# Patient Record
Sex: Female | Born: 1960 | Race: White | Hispanic: No | Marital: Married | Smoking: Current every day smoker
Health system: Southern US, Community
[De-identification: ages and names within clinical notes are randomized; demographics above are authoritative.]

## PROBLEM LIST (undated history)

## (undated) DIAGNOSIS — I10 Essential (primary) hypertension: Secondary | ICD-10-CM

## (undated) DIAGNOSIS — F10239 Alcohol dependence with withdrawal, unspecified: Secondary | ICD-10-CM

## (undated) DIAGNOSIS — F1010 Alcohol abuse, uncomplicated: Secondary | ICD-10-CM

## (undated) DIAGNOSIS — F32A Depression, unspecified: Secondary | ICD-10-CM

## (undated) DIAGNOSIS — F329 Major depressive disorder, single episode, unspecified: Secondary | ICD-10-CM

## (undated) DIAGNOSIS — I6783 Posterior reversible encephalopathy syndrome: Secondary | ICD-10-CM

## (undated) DIAGNOSIS — I214 Non-ST elevation (NSTEMI) myocardial infarction: Secondary | ICD-10-CM

## (undated) DIAGNOSIS — R569 Unspecified convulsions: Secondary | ICD-10-CM

## (undated) DIAGNOSIS — E785 Hyperlipidemia, unspecified: Secondary | ICD-10-CM

## (undated) DIAGNOSIS — I248 Other forms of acute ischemic heart disease: Secondary | ICD-10-CM

## (undated) DIAGNOSIS — K8020 Calculus of gallbladder without cholecystitis without obstruction: Secondary | ICD-10-CM

---

## 2015-11-18 ENCOUNTER — Inpatient Hospital Stay (HOSPITAL_COMMUNITY): Payer: MEDICAID

## 2015-11-18 ENCOUNTER — Inpatient Hospital Stay (HOSPITAL_COMMUNITY): Payer: Self-pay

## 2015-11-18 ENCOUNTER — Inpatient Hospital Stay (HOSPITAL_COMMUNITY)
Admission: EM | Admit: 2015-11-18 | Discharge: 2015-11-24 | DRG: 896 | Disposition: A | Discharge status: Home / Self Care | Payer: Self-pay | Source: Other Acute Inpatient Hospital | Attending: Internal Medicine | Admitting: Internal Medicine

## 2015-11-18 ENCOUNTER — Encounter (HOSPITAL_COMMUNITY): Payer: Self-pay | Admitting: Pulmonary Disease

## 2015-11-18 DIAGNOSIS — Y900 Blood alcohol level of less than 20 mg/100 ml: Secondary | ICD-10-CM | POA: Diagnosis present

## 2015-11-18 DIAGNOSIS — I959 Hypotension, unspecified: Secondary | ICD-10-CM | POA: Diagnosis present

## 2015-11-18 DIAGNOSIS — F1010 Alcohol abuse, uncomplicated: Secondary | ICD-10-CM | POA: Diagnosis present

## 2015-11-18 DIAGNOSIS — M47812 Spondylosis without myelopathy or radiculopathy, cervical region: Secondary | ICD-10-CM | POA: Diagnosis present

## 2015-11-18 DIAGNOSIS — N179 Acute kidney failure, unspecified: Secondary | ICD-10-CM | POA: Diagnosis not present

## 2015-11-18 DIAGNOSIS — F10231 Alcohol dependence with withdrawal delirium: Secondary | ICD-10-CM | POA: Diagnosis present

## 2015-11-18 DIAGNOSIS — K922 Gastrointestinal hemorrhage, unspecified: Secondary | ICD-10-CM | POA: Diagnosis not present

## 2015-11-18 DIAGNOSIS — F329 Major depressive disorder, single episode, unspecified: Secondary | ICD-10-CM | POA: Diagnosis present

## 2015-11-18 DIAGNOSIS — E876 Hypokalemia: Secondary | ICD-10-CM | POA: Diagnosis not present

## 2015-11-18 DIAGNOSIS — F10239 Alcohol dependence with withdrawal, unspecified: Secondary | ICD-10-CM

## 2015-11-18 DIAGNOSIS — B9562 Methicillin resistant Staphylococcus aureus infection as the cause of diseases classified elsewhere: Secondary | ICD-10-CM | POA: Diagnosis present

## 2015-11-18 DIAGNOSIS — K7290 Hepatic failure, unspecified without coma: Secondary | ICD-10-CM | POA: Diagnosis present

## 2015-11-18 DIAGNOSIS — R569 Unspecified convulsions: Secondary | ICD-10-CM | POA: Diagnosis present

## 2015-11-18 DIAGNOSIS — I6783 Posterior reversible encephalopathy syndrome: Secondary | ICD-10-CM

## 2015-11-18 DIAGNOSIS — Z01818 Encounter for other preprocedural examination: Secondary | ICD-10-CM

## 2015-11-18 DIAGNOSIS — Z6832 Body mass index (BMI) 32.0-32.9, adult: Secondary | ICD-10-CM

## 2015-11-18 DIAGNOSIS — I6789 Other cerebrovascular disease: Secondary | ICD-10-CM

## 2015-11-18 DIAGNOSIS — Z452 Encounter for adjustment and management of vascular access device: Secondary | ICD-10-CM

## 2015-11-18 DIAGNOSIS — F17200 Nicotine dependence, unspecified, uncomplicated: Secondary | ICD-10-CM | POA: Diagnosis present

## 2015-11-18 DIAGNOSIS — I639 Cerebral infarction, unspecified: Secondary | ICD-10-CM

## 2015-11-18 DIAGNOSIS — J9690 Respiratory failure, unspecified, unspecified whether with hypoxia or hypercapnia: Secondary | ICD-10-CM

## 2015-11-18 DIAGNOSIS — I248 Other forms of acute ischemic heart disease: Secondary | ICD-10-CM | POA: Diagnosis present

## 2015-11-18 DIAGNOSIS — K8020 Calculus of gallbladder without cholecystitis without obstruction: Secondary | ICD-10-CM | POA: Diagnosis present

## 2015-11-18 DIAGNOSIS — E785 Hyperlipidemia, unspecified: Secondary | ICD-10-CM | POA: Diagnosis present

## 2015-11-18 DIAGNOSIS — I214 Non-ST elevation (NSTEMI) myocardial infarction: Secondary | ICD-10-CM | POA: Diagnosis present

## 2015-11-18 DIAGNOSIS — J9601 Acute respiratory failure with hypoxia: Secondary | ICD-10-CM | POA: Diagnosis present

## 2015-11-18 DIAGNOSIS — D61818 Other pancytopenia: Secondary | ICD-10-CM | POA: Diagnosis present

## 2015-11-18 DIAGNOSIS — I110 Hypertensive heart disease with heart failure: Secondary | ICD-10-CM | POA: Diagnosis present

## 2015-11-18 DIAGNOSIS — I6523 Occlusion and stenosis of bilateral carotid arteries: Secondary | ICD-10-CM | POA: Diagnosis present

## 2015-11-18 DIAGNOSIS — E872 Acidosis: Secondary | ICD-10-CM | POA: Diagnosis present

## 2015-11-18 DIAGNOSIS — I2489 Other forms of acute ischemic heart disease: Secondary | ICD-10-CM | POA: Diagnosis present

## 2015-11-18 DIAGNOSIS — G9341 Metabolic encephalopathy: Secondary | ICD-10-CM | POA: Diagnosis present

## 2015-11-18 DIAGNOSIS — F419 Anxiety disorder, unspecified: Secondary | ICD-10-CM | POA: Diagnosis present

## 2015-11-18 DIAGNOSIS — J690 Pneumonitis due to inhalation of food and vomit: Secondary | ICD-10-CM | POA: Diagnosis present

## 2015-11-18 DIAGNOSIS — I5021 Acute systolic (congestive) heart failure: Secondary | ICD-10-CM | POA: Diagnosis present

## 2015-11-18 DIAGNOSIS — R451 Restlessness and agitation: Secondary | ICD-10-CM | POA: Diagnosis present

## 2015-11-18 DIAGNOSIS — R7881 Bacteremia: Secondary | ICD-10-CM | POA: Diagnosis present

## 2015-11-18 DIAGNOSIS — F10230 Alcohol dependence with withdrawal, uncomplicated: Secondary | ICD-10-CM

## 2015-11-18 DIAGNOSIS — I6340 Cerebral infarction due to embolism of unspecified cerebral artery: Secondary | ICD-10-CM | POA: Diagnosis present

## 2015-11-18 DIAGNOSIS — F10939 Alcohol use, unspecified with withdrawal, unspecified: Secondary | ICD-10-CM

## 2015-11-18 DIAGNOSIS — G9340 Encephalopathy, unspecified: Secondary | ICD-10-CM | POA: Diagnosis present

## 2015-11-18 DIAGNOSIS — Z8673 Personal history of transient ischemic attack (TIA), and cerebral infarction without residual deficits: Secondary | ICD-10-CM

## 2015-11-18 DIAGNOSIS — R197 Diarrhea, unspecified: Secondary | ICD-10-CM | POA: Diagnosis present

## 2015-11-18 DIAGNOSIS — F10232 Alcohol dependence with withdrawal with perceptual disturbance: Principal | ICD-10-CM | POA: Diagnosis present

## 2015-11-18 DIAGNOSIS — F10931 Alcohol use, unspecified with withdrawal delirium: Secondary | ICD-10-CM | POA: Diagnosis present

## 2015-11-18 DIAGNOSIS — E669 Obesity, unspecified: Secondary | ICD-10-CM | POA: Diagnosis present

## 2015-11-18 HISTORY — DX: Depression, unspecified: F32.A

## 2015-11-18 HISTORY — DX: Alcohol dependence with withdrawal, unspecified: F10.239

## 2015-11-18 HISTORY — DX: Alcohol abuse, uncomplicated: F10.10

## 2015-11-18 HISTORY — DX: Posterior reversible encephalopathy syndrome: I67.83

## 2015-11-18 HISTORY — DX: Hyperlipidemia, unspecified: E78.5

## 2015-11-18 HISTORY — DX: Alcohol use, unspecified with withdrawal, unspecified: F10.939

## 2015-11-18 HISTORY — DX: Major depressive disorder, single episode, unspecified: F32.9

## 2015-11-18 HISTORY — DX: Essential (primary) hypertension: I10

## 2015-11-18 HISTORY — DX: Other forms of acute ischemic heart disease: I24.8

## 2015-11-18 HISTORY — DX: Unspecified convulsions: R56.9

## 2015-11-18 HISTORY — DX: Calculus of gallbladder without cholecystitis without obstruction: K80.20

## 2015-11-18 HISTORY — DX: Non-ST elevation (NSTEMI) myocardial infarction: I21.4

## 2015-11-18 LAB — AMMONIA: AMMONIA: 27 umol/L (ref 9–35)

## 2015-11-18 LAB — LIPASE, BLOOD: LIPASE: 24 U/L (ref 11–51)

## 2015-11-18 LAB — RAPID URINE DRUG SCREEN, HOSP PERFORMED
Amphetamines: NOT DETECTED
BARBITURATES: NOT DETECTED
BENZODIAZEPINES: POSITIVE — AB
COCAINE: NOT DETECTED
Opiates: NOT DETECTED
TETRAHYDROCANNABINOL: NOT DETECTED

## 2015-11-18 LAB — COMPREHENSIVE METABOLIC PANEL
ALT: 51 U/L (ref 14–54)
AST: 182 U/L — AB (ref 15–41)
Albumin: 3 g/dL — ABNORMAL LOW (ref 3.5–5.0)
Alkaline Phosphatase: 66 U/L (ref 38–126)
Anion gap: 13 (ref 5–15)
BUN: 9 mg/dL (ref 6–20)
CHLORIDE: 106 mmol/L (ref 101–111)
CO2: 20 mmol/L — ABNORMAL LOW (ref 22–32)
CREATININE: 0.69 mg/dL (ref 0.44–1.00)
Calcium: 7.3 mg/dL — ABNORMAL LOW (ref 8.9–10.3)
GFR calc Af Amer: >60 mL/min (ref >60)
Glucose, Bld: 93 mg/dL (ref 65–99)
Potassium: 3.1 mmol/L — ABNORMAL LOW (ref 3.5–5.1)
Sodium: 139 mmol/L (ref 135–145)
Total Bilirubin: 1.4 mg/dL — ABNORMAL HIGH (ref 0.3–1.2)
Total Protein: 5.1 g/dL — ABNORMAL LOW (ref 6.5–8.1)

## 2015-11-18 LAB — CBC WITH DIFFERENTIAL/PLATELET
BASOS ABS: 0 10*3/uL (ref 0.0–0.1)
BASOS PCT: 0 %
EOS ABS: 0 10*3/uL (ref 0.0–0.7)
EOS PCT: 0 %
HCT: 32.3 % — ABNORMAL LOW (ref 36.0–46.0)
Hemoglobin: 11 g/dL — ABNORMAL LOW (ref 12.0–15.0)
LYMPHS PCT: 13 %
Lymphs Abs: 0.7 10*3/uL (ref 0.7–4.0)
MCH: 33.4 pg (ref 26.0–34.0)
MCHC: 34.1 g/dL (ref 30.0–36.0)
MCV: 98.2 fL (ref 78.0–100.0)
Monocytes Absolute: 0.2 10*3/uL (ref 0.1–1.0)
Monocytes Relative: 4 %
Neutro Abs: 4.4 10*3/uL (ref 1.7–7.7)
Neutrophils Relative %: 83 %
PLATELETS: 64 10*3/uL — AB (ref 150–400)
RBC: 3.29 MIL/uL — AB (ref 3.87–5.11)
RDW: 14.6 % (ref 11.5–15.5)
WBC: 5.3 10*3/uL (ref 4.0–10.5)

## 2015-11-18 LAB — BLOOD GAS, ARTERIAL
Acid-base deficit: 5.5 mmol/L — ABNORMAL HIGH (ref 0.0–2.0)
BICARBONATE: 19.4 meq/L — AB (ref 20.0–24.0)
DRAWN BY: 398981
FIO2: 0.4
O2 Saturation: 98.9 %
PCO2 ART: 38.1 mmHg (ref 35.0–45.0)
PEEP: 5 cmH2O
Patient temperature: 98.6
RATE: 18 resp/min
TCO2: 20.6 mmol/L (ref 0–100)
VT: 420 mL
pH, Arterial: 7.328 — ABNORMAL LOW (ref 7.350–7.450)
pO2, Arterial: 133 mmHg — ABNORMAL HIGH (ref 80.0–100.0)

## 2015-11-18 LAB — PHOSPHORUS
Phosphorus: 2.8 mg/dL (ref 2.5–4.6)
Phosphorus: 3 mg/dL (ref 2.5–4.6)

## 2015-11-18 LAB — MRSA PCR SCREENING: MRSA BY PCR: POSITIVE — AB

## 2015-11-18 LAB — HEPARIN LEVEL (UNFRACTIONATED): Heparin Unfractionated: 0.27 IU/mL — ABNORMAL LOW (ref 0.30–0.70)

## 2015-11-18 LAB — TROPONIN I
TROPONIN I: 1.9 ng/mL — AB (ref <0.031)
Troponin I: 6.07 ng/mL (ref <0.031)
Troponin I: 3.26 ng/mL (ref <0.031)

## 2015-11-18 LAB — MAGNESIUM
MAGNESIUM: 1.9 mg/dL (ref 1.7–2.4)
MAGNESIUM: 1.7 mg/dL (ref 1.7–2.4)

## 2015-11-18 LAB — ECHOCARDIOGRAM COMPLETE
HEIGHTINCHES: 63 in
WEIGHTICAEL: 2853.63 [oz_av]

## 2015-11-18 LAB — CK TOTAL AND CKMB (NOT AT ARMC)
CK, MB: 27 ng/mL — ABNORMAL HIGH (ref 0.5–5.0)
Relative Index: 10.7 — ABNORMAL HIGH (ref 0.0–2.5)
Total CK: 252 U/L — ABNORMAL HIGH (ref 38–234)

## 2015-11-18 LAB — APTT: APTT: 31 s (ref 24–37)

## 2015-11-18 LAB — PROTIME-INR
INR: 1.09 (ref 0.00–1.49)
PROTHROMBIN TIME: 14.3 s (ref 11.6–15.2)

## 2015-11-18 LAB — AMYLASE: AMYLASE: 64 U/L (ref 28–100)

## 2015-11-18 LAB — OCCULT BLOOD GASTRIC / DUODENUM (SPECIMEN CUP): Occult Blood, Gastric: POSITIVE — AB

## 2015-11-18 LAB — TRIGLYCERIDES: Triglycerides: 428 mg/dL — ABNORMAL HIGH (ref <150)

## 2015-11-18 LAB — CK: CK TOTAL: 268 U/L — AB (ref 38–234)

## 2015-11-18 LAB — PROCALCITONIN: Procalcitonin: 0.27 ng/mL

## 2015-11-18 LAB — POTASSIUM: Potassium: 3.2 mmol/L — ABNORMAL LOW (ref 3.5–5.1)

## 2015-11-18 LAB — LACTIC ACID, PLASMA: LACTIC ACID, VENOUS: 0.8 mmol/L (ref 0.5–2.0)

## 2015-11-18 MED ORDER — IOPAMIDOL (ISOVUE-300) INJECTION 61%
INTRAVENOUS | Status: AC
  Administered 2015-11-18: 100 mL
  Filled 2015-11-18: qty 100

## 2015-11-18 MED ORDER — PROPOFOL 1000 MG/100ML IV EMUL
5.0000 ug/kg/min | INTRAVENOUS | Status: DC
  Administered 2015-11-18 (×2): 30 ug/kg/min via INTRAVENOUS
  Administered 2015-11-18: 50 ug/kg/min via INTRAVENOUS
  Administered 2015-11-19: 45 ug/kg/min via INTRAVENOUS
  Administered 2015-11-19: 50 ug/kg/min via INTRAVENOUS
  Administered 2015-11-19 – 2015-11-20 (×3): 60 ug/kg/min via INTRAVENOUS
  Administered 2015-11-20: 30 ug/kg/min via INTRAVENOUS
  Administered 2015-11-20: 60 ug/kg/min via INTRAVENOUS
  Filled 2015-11-18 (×12): qty 100

## 2015-11-18 MED ORDER — PANTOPRAZOLE SODIUM 40 MG IV SOLR: 40.0000 mg | Freq: Every day | INTRAVENOUS | Status: DC

## 2015-11-18 MED ORDER — ANTISEPTIC ORAL RINSE SOLUTION (CORINZ)
7.0000 mL | Freq: Four times a day (QID) | OROMUCOSAL | Status: DC
Start: 2015-11-18 — End: 2015-11-19
  Administered 2015-11-18 – 2015-11-19 (×5): 7 mL via OROMUCOSAL

## 2015-11-18 MED ORDER — CHLORHEXIDINE GLUCONATE 0.12% ORAL RINSE (MEDLINE KIT)
15.0000 mL | Freq: Two times a day (BID) | OROMUCOSAL | Status: DC
  Administered 2015-11-18 – 2015-11-20 (×5): 15 mL via OROMUCOSAL

## 2015-11-18 MED ORDER — DEXMEDETOMIDINE HCL IN NACL 200 MCG/50ML IV SOLN
0.0000 ug/kg/h | INTRAVENOUS | Status: DC
  Administered 2015-11-18: 0.5 ug/kg/h via INTRAVENOUS
  Filled 2015-11-18: qty 50

## 2015-11-18 MED ORDER — PHENYLEPHRINE HCL 10 MG/ML IJ SOLN
30.0000 ug/min | INTRAVENOUS | Status: DC
  Administered 2015-11-18: 30 ug/min via INTRAVENOUS
  Filled 2015-11-18: qty 1

## 2015-11-18 MED ORDER — SODIUM CHLORIDE 0.9 % IV SOLN
3.0000 g | Freq: Three times a day (TID) | INTRAVENOUS | Status: DC
  Administered 2015-11-18 – 2015-11-24 (×18): 3 g via INTRAVENOUS
  Filled 2015-11-18 (×24): qty 3

## 2015-11-18 MED ORDER — MIDAZOLAM HCL 2 MG/2ML IJ SOLN
INTRAMUSCULAR | Status: AC
  Administered 2015-11-18: 2 mg
  Filled 2015-11-18: qty 2

## 2015-11-18 MED ORDER — HEPARIN SODIUM (PORCINE) 5000 UNIT/ML IJ SOLN
5000.0000 [IU] | Freq: Three times a day (TID) | INTRAMUSCULAR | Status: DC
  Administered 2015-11-18: 5000 [IU] via SUBCUTANEOUS
  Filled 2015-11-18: qty 1

## 2015-11-18 MED ORDER — PANTOPRAZOLE SODIUM 40 MG IV SOLR
40.0000 mg | Freq: Two times a day (BID) | INTRAVENOUS | Status: DC
  Administered 2015-11-18 – 2015-11-22 (×10): 40 mg via INTRAVENOUS
  Filled 2015-11-18 (×10): qty 40

## 2015-11-18 MED ORDER — PROPOFOL 1000 MG/100ML IV EMUL
0.0000 ug/kg/min | INTRAVENOUS | Status: DC
  Administered 2015-11-18: 19.984 ug/kg/min via INTRAVENOUS

## 2015-11-18 MED ORDER — SODIUM CHLORIDE 0.9 % IV SOLN
1000.0000 mg | Freq: Two times a day (BID) | INTRAVENOUS | Status: DC
  Administered 2015-11-19 – 2015-11-22 (×9): 1000 mg via INTRAVENOUS
  Filled 2015-11-18 (×12): qty 10

## 2015-11-18 MED ORDER — PHENYLEPHRINE HCL 10 MG/ML IJ SOLN
30.0000 ug/min | INTRAVENOUS | Status: DC
  Administered 2015-11-18 (×2): 150 ug/min via INTRAVENOUS
  Administered 2015-11-18: 140 ug/min via INTRAVENOUS
  Administered 2015-11-19: 120 ug/min via INTRAVENOUS
  Administered 2015-11-19: 40 ug/min via INTRAVENOUS
  Filled 2015-11-18 (×6): qty 4

## 2015-11-18 MED ORDER — PNEUMOCOCCAL VAC POLYVALENT 25 MCG/0.5ML IJ INJ
0.5000 mL | INJECTION | INTRAMUSCULAR | Status: AC
  Administered 2015-11-19: 0.5 mL via INTRAMUSCULAR
  Filled 2015-11-18: qty 0.5

## 2015-11-18 MED ORDER — POTASSIUM CHLORIDE CRYS ER 20 MEQ PO TBCR
40.0000 meq | EXTENDED_RELEASE_TABLET | Freq: Once | ORAL | Status: AC
  Administered 2015-11-18: 40 meq via ORAL
  Filled 2015-11-18: qty 2

## 2015-11-18 MED ORDER — FENTANYL CITRATE (PF) 100 MCG/2ML IJ SOLN
100.0000 ug | INTRAMUSCULAR | Status: AC | PRN
  Administered 2015-11-18 – 2015-11-19 (×3): 100 ug via INTRAVENOUS
  Filled 2015-11-18 (×2): qty 2

## 2015-11-18 MED ORDER — HEPARIN (PORCINE) IN NACL 100-0.45 UNIT/ML-% IJ SOLN
1100.0000 [IU]/h | INTRAMUSCULAR | Status: AC
  Administered 2015-11-18: 850 [IU]/h via INTRAVENOUS
  Administered 2015-11-19: 1100 [IU]/h via INTRAVENOUS
  Filled 2015-11-18 (×4): qty 250

## 2015-11-18 MED ORDER — SODIUM CHLORIDE 0.9 % IV SOLN
INTRAVENOUS | Status: DC
  Administered 2015-11-18 – 2015-11-20 (×3): via INTRAVENOUS

## 2015-11-18 MED ORDER — LACTULOSE 10 GM/15ML PO SOLN: 30.0000 g | Freq: Two times a day (BID) | ORAL | Status: DC

## 2015-11-18 MED ORDER — SODIUM CHLORIDE 0.9 % IV BOLUS (SEPSIS)
1000.0000 mL | Freq: Once | INTRAVENOUS | Status: AC
  Administered 2015-11-18: 1000 mL via INTRAVENOUS

## 2015-11-18 MED ORDER — FENTANYL CITRATE (PF) 100 MCG/2ML IJ SOLN
100.0000 ug | INTRAMUSCULAR | Status: DC | PRN
  Administered 2015-11-20: 100 ug via INTRAVENOUS
  Filled 2015-11-18 (×2): qty 2

## 2015-11-18 MED ORDER — SODIUM CHLORIDE 0.9 % IV SOLN: 250.0000 mL | INTRAVENOUS | Status: DC | PRN

## 2015-11-18 MED ORDER — LORAZEPAM 2 MG/ML IJ SOLN: 2.0000 mg | INTRAMUSCULAR | Status: DC | PRN

## 2015-11-18 MED ORDER — PROPOFOL 1000 MG/100ML IV EMUL
INTRAVENOUS | Status: AC
  Filled 2015-11-18: qty 100

## 2015-11-18 MED ORDER — SODIUM CHLORIDE 0.9 % IV SOLN
Freq: Once | INTRAVENOUS | Status: AC
  Administered 2015-11-18: 08:00:00 via INTRAVENOUS

## 2015-11-18 MED ORDER — MIDAZOLAM HCL 2 MG/2ML IJ SOLN: 2.0000 mg | Freq: Once | INTRAMUSCULAR | Status: AC

## 2015-11-18 NOTE — Procedures (Signed)
Central Venous Catheter Insertion Procedure Note Abigail HutchingSusan Hull [id number] 1961/02/27  Procedure: Insertion of Central Venous Catheter Indications: Assessment of intravascular volume, Drug and/or fluid administration and Frequent blood sampling  Procedure Details Consent: Risks of procedure as well as the alternatives and risks of each were explained to the (patient/caregiver).  Consent for procedure obtained. Time Out: Verified patient identification, verified procedure, site/side was marked, verified correct patient position, special equipment/implants available, medications/allergies/relevent history reviewed, required imaging and test results available.  Performed  Maximum sterile technique was used including antiseptics, cap, gloves, gown, hand hygiene, mask and sheet. Skin prep: Chlorhexidine; local anesthetic administered A antimicrobial bonded/coated triple lumen catheter was placed in the left internal jugular vein using the Seldinger technique.  Evaluation Blood flow good Complications: No apparent complications Patient did tolerate procedure well. Chest X-ray ordered to verify placement.  CXR: pending.  Procedure performed under direct ultrasound guidance for real time vessel cannulation.      Abigail Hull, [place]PA - [facility] Pager: [phone]  or [phone] 11/18/2015, 10:51 AM

## 2015-11-18 NOTE — Consult Note (Signed)
Requesting Physician: Dr.  Corrie Dandy    Reason for consultation:  To evaluate for encephalopathy  HPI:                                                                                                                                         Abigail Hull is an 55 y.o. female patient who is admitted to the [facility] ICU with altered mental status, intubated due to respiratory failure.  past medical history significant for alcohol abuse and hypertension. 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 She typically drinks about 1 pint of hard alcohol daily, and has not had a drink for about 3 days now. She was last seen normal 5/16 at around noon. Since that time she has had significant nausea vomiting, blurry vision, difficulty ambulating, and apparent hallucinations. Her mental status continued to worsen since that time, until her husband witnessed her having a seizure-like episode and called EMS. Upon arrival to [facility] emergency department she was unresponsive and emergently intubated for airway protection. CT of the head showed bilateral areas of bilateral parietal occipital foci of poorly defined hypoattenuation. Ammonia level was also elevated. She was transferred to [facility] for ICU admission.  Past Medical History: Past Medical History  Diagnosis Date  . Hypertension   . Depression   . Alcohol abuse   . Cholelithiasis     No past surgical history on file.  Family History: No family history on file.  Social History:   has no tobacco, alcohol, and drug history on file.  Allergies:  Not on File   Medications:                                                                                                                         Current facility-administered medications:  .  0.9 %  sodium chloride infusion, 250 mL, Intravenous, PRN, Corey Harold, NP .  0.9 %  sodium chloride infusion, ,  Intravenous, Continuous, Jose Angelo A Corrie Dandy, MD, Last Rate: 100 mL/hr at 11/18/15 1800 .  Ampicillin-Sulbactam (UNASYN) 3 g in sodium chloride 0.9 % 100 mL IVPB, 3 g, Intravenous, Q8H, Romona Curls, RPH, 3 g at 11/18/15 1815 .  antiseptic oral rinse solution (CORINZ), 7 mL, Mouth Rinse, QID, Jose  Shirl Harris, MD, 7 mL at 11/18/15 1540 .  chlorhexidine gluconate (SAGE KIT) (PERIDEX) 0.12 % solution 15 mL, 15 mL, Mouth Rinse, BID, Jose Angelo A de Fenton, MD, 15 mL at 11/18/15 0900 .  fentaNYL (SUBLIMAZE) injection 100 mcg, 100 mcg, Intravenous, Q15 min PRN, Corey Harold, NP, 100 mcg at 11/18/15 [phone] .  fentaNYL (SUBLIMAZE) injection 100 mcg, 100 mcg, Intravenous, Q2H PRN, Corey Harold, NP .  heparin ADULT infusion 100 units/mL (25000 units/250 mL), 850 Units/hr, Intravenous, Continuous, Romona Curls, RPH, Last Rate: 8.5 mL/hr at 11/18/15 1800, 850 Units/hr at 11/18/15 1800 .  LORazepam (ATIVAN) injection 2 mg, 2 mg, Intravenous, Q5 min PRN, Corey Harold, NP .  pantoprazole (PROTONIX) injection 40 mg, 40 mg, Intravenous, Q12H, Corey Harold, NP, 40 mg at 11/18/15 1100 .  phenylephrine (NEO-SYNEPHRINE) 40 mg in dextrose 5 % 250 mL (0.16 mg/mL) infusion, 30-200 mcg/min, Intravenous, Continuous, Raylene Miyamoto, MD, Last Rate: 56.3 mL/hr at 11/18/15 1800, 150 mcg/min at 11/18/15 1800 .  [START ON 11/19/2015] pneumococcal 23 valent vaccine (PNU-IMMUNE) injection 0.5 mL, 0.5 mL, Intramuscular, Tomorrow-1000, Corey Harold, NP .  propofol (DIPRIVAN) 1000 MG/100ML infusion, 5-70 mcg/kg/min, Intravenous, Titrated, Jose Angelo A Corrie Dandy, MD, Last Rate: 14.6 mL/hr at 11/18/15 1800, 30 mcg/kg/min at 11/18/15 1800   ROS:                                                                                                                                       History   unobtainable from patient due to intubated  Neurologic Examination:                                                                                                     Today's Vitals   11/18/15 1512 11/18/15 1600 11/18/15 1700 11/18/15 1800  BP:  125/91 138/86 136/74  Pulse:  60 74 63  Temp:  101.1 F (38.4 C) 101.3 F (38.5 C) 101.8 F (38.8 C)  TempSrc:      Resp:  20 20 18   Height:      Weight:      SpO2: 100% 98% 100% 100%   Intubated, sedated, no gaze deviation or nystagmus. Pupils equal and reactive, corneal's present. Gag present. Withdraws to stimulus in all 4 extremities.      Lab Results: Basic Metabolic Panel:  Recent Labs Lab 11/18/15 0643 11/18/15 1200  NA 139  --   K 3.1* 3.2*  CL  106  --   CO2 20*  --   GLUCOSE 93  --   BUN 9  --   CREATININE 0.69  --   CALCIUM 7.3*  --   MG 1.9 1.7  PHOS 2.8 3.0    Liver Function Tests:  Recent Labs Lab 11/18/15 0643  AST 182*  ALT 51  ALKPHOS 66  BILITOT 1.4*  PROT 5.1*  ALBUMIN 3.0*    Recent Labs Lab 11/18/15 0643  LIPASE 24  AMYLASE 64    Recent Labs Lab 11/18/15 0704  AMMONIA 27    CBC:  Recent Labs Lab 11/18/15 0643  WBC 5.3  NEUTROABS 4.4  HGB 11.0*  HCT 32.3*  MCV 98.2  PLT 64*    Cardiac Enzymes:  Recent Labs Lab 11/18/15 0643 11/18/15 1200  CKTOTAL 268* 252*  CKMB  --  27.0*  TROPONINI 6.07* 3.26*    Lipid Panel:  Recent Labs Lab 11/18/15 0704  TRIG 428*    CBG: No results for input(s): GLUCAP in the last 168 hours.  Microbiology: Results for orders placed or performed during the hospital encounter of 11/18/15  MRSA PCR Screening     Status: Abnormal   Collection Time: 11/18/15  5:25 AM  Result Value Ref Range Status   MRSA by PCR POSITIVE (A) NEGATIVE Final    Comment:        The GeneXpert MRSA Assay (FDA approved for NASAL specimens only), is one component of a comprehensive MRSA colonization surveillance program. It is not intended to diagnose MRSA infection nor to guide or monitor treatment for MRSA infections. RESULT CALLED TO, READ BACK BY AND VERIFIED WITH: Birdena Jubilee RN  9:35 11/18/15 ([email or web address])      Imaging: Ct Head Wo Contrast  11/18/2015  CLINICAL DATA:  Acute encephalopathy. Rule out intracranial hemorrhage before starting heparin. Bilateral parietal occipital hypo attenuation on outside CT per EMR, presumed stroke EXAM: CT HEAD WITHOUT CONTRAST TECHNIQUE: Contiguous axial images were obtained from the base of the skull through the vertex without intravenous contrast. COMPARISON:  None. FINDINGS: Skull and Sinuses:Negative for fracture or destructive process. The visualized mastoids, middle ears, and imaged paranasal sinuses are clear. Advanced right TMJ arthritis. Visualized orbits: Negative. Brain: There is cortical and subcortical low-density fairly symmetrically in the occipital and parietal regions without superimposed hemorrhage. No high-density vessel sign. Elsewhere normal appearance of the brain. No hydrocephalus. IMPRESSION: Symmetric parietal-occipital cortically based edema or infarct with pattern suggesting posterior reversible encephalopathy syndrome. No superimposed hemorrhage. Electronically Signed   By: Monte Fantasia M.D.   On: 11/18/2015 11:55   Ct Abdomen Pelvis W Contrast  11/18/2015  CLINICAL DATA:  Evaluate the source of blood from NG tube, intubated EXAM: CT ABDOMEN AND PELVIS WITH CONTRAST TECHNIQUE: Multidetector CT imaging of the abdomen and pelvis was performed using the standard protocol following bolus administration of intravenous contrast. CONTRAST:  163m ISOVUE-300 IOPAMIDOL (ISOVUE-300) INJECTION 61% COMPARISON:  None. FINDINGS: Lower chest: Bilateral tiny pleural effusion. There is bilateral lower lobe posterior small atelectasis or infiltrate. Hepatobiliary: Significant fatty infiltration of the liver. Mild hepatomegaly. There is thickening of gallbladder wall up to 6.5 mm. Probable gallbladder sludge. There is a calcified gallstone in gallbladder neck region measures 9.5 mm. No intrahepatic biliary ductal dilatation. No CBD  dilatation. Pancreas: Enhanced pancreas is unremarkable. Spleen: Enhanced spleen is unremarkable. Adrenals/Urinary Tract: No adrenal gland mass. Enhanced kidneys are symmetrical in size. No hydronephrosis or hydroureter. Delayed renal images shows bilateral renal symmetrical excretion. Bilateral visualized  proximal ureter is unremarkable. There is mild distended urinary bladder. A Foley catheter is noted within urinary bladder. Small amount of air within urinary bladder anteriorly is probable post instrumentation. Stomach/Bowel: There is no gastric outlet obstruction. There is a NG tube in place with tip in proximal stomach against the lateral gastric wall. There is no evidence of gastric wall hematoma adjacent to tip of the NG tube at. No evidence of intraluminal hematoma. No pericecal inflammation. Terminal ileum is unremarkable. Normal appendix partially visualized in axial image 72. There is no distal colonic obstruction. The right colon descending colon and sigmoid colon is empty collapsed. Some colonic gas noted in transverse colon. No small bowel obstruction. No thickened or dilated small bowel loops. Vascular/Lymphatic: There is no retroperitoneal or mesenteric adenopathy. No aortic aneurysm. Reproductive: The uterus and ovaries are unremarkable. The uterus is small size. No adnexal mass. Other: No ascites or free air. No retroperitoneal or mesenteric hematoma. No inguinal adenopathy. Musculoskeletal: No destructive bony lesions are noted. Degenerative changes are noted lumbar spine. Probable Schmorl's node deformity noted upper endplate of L4 vertebral body. Significant disc space flattening with vacuum disc phenomenon mild anterior spurring and endplate sclerotic changes at L1-L2 and L2-L3 level. Mild upper lumbar levoscoliosis. IMPRESSION: 1. There is significant fatty infiltration of the liver. Mild hepatomegaly. 2. There is thickening of gallbladder wall up to 6.5 mm. Clinical correlation is necessary  to exclude cholecystitis. Further correlation with gallbladder ultrasound could be performed as clinically warranted. Probable sludge within gallbladder lumen. There is a calcified gallstone in neck of the gallbladder measures 9.5 mm. 3. There is NG tube with tip in proximal stomach with tip against the lateral gastric wall. There is no evidence of intraluminal or mural hematoma. 4. Tiny bilateral pleural effusion with bilateral basilar posterior atelectasis or infiltrate. 5. Normal appendix.  No pericecal inflammation. 6. No small bowel obstruction. 7. Mild distended urinary bladder. Small amount of air within urinary bladder probable post instrumentation. The bladder catheter is noted. 8. Unremarkable uterus and ovaries. 9. Degenerative changes lumbar spine. Electronically Signed   By: Lahoma Crocker M.D.   On: 11/18/2015 12:59   Dg Chest Port 1 View  11/18/2015  CLINICAL DATA:  Status post central line placement EXAM: PORTABLE CHEST 1 VIEW COMPARISON:  11/18/2015 FINDINGS: Endotracheal tube and nasogastric catheter are again seen and stable. A new left jugular central line is noted with the catheter tip in the mid superior vena cava. No pneumothorax is noted. Some crowding of the vascular markings is noted due to a poor inspiratory effort. IMPRESSION: No pneumothorax following central line placement. Electronically Signed   By: Inez Catalina M.D.   On: 11/18/2015 11:19   Dg Chest Port 1 View  11/18/2015  CLINICAL DATA:  Endotracheal tube.  History of hypertension. EXAM: PORTABLE CHEST 1 VIEW COMPARISON:  None. FINDINGS: Endotracheal tube placed with tip measuring 3.5 cm above the carina. Enteric tube is present with tip off of the field of view but below the left hemidiaphragm. Heart size and pulmonary vascularity are normal. Tortuous aorta. Prominent right paratracheal shadow is likely vascular. No focal airspace disease or consolidation in the lungs. No blunting of costophrenic angles. No pneumothorax.  IMPRESSION: Appliances appear in satisfactory position. No evidence of active pulmonary disease. Electronically Signed   By: Lucienne Capers M.D.   On: 11/18/2015 06:48    Assessment and plan:   Opal Dinning is an 55 y.o. female patient who presented with AMS, etoh abuse, CT head  is suggestive of  PRES.  EEG showed no evidence of seizures,  but frequent abnormal discharges noted, localized to right frontal leads.  Will start keppra 1 gm Q12H.   We'll follow-up

## 2015-11-18 NOTE — Progress Notes (Signed)
CRITICAL VALUE ALERT  Critical value received: troponin 6.07  Date of notification:  5/17  Time of notification: 0752   Critical value read back:Yes.    Nurse who received alert:  Verlin DikeKimberly Araf Clugston  MD notified (1st page):  Dr. Tommi Rumpse dios  Time of first page:  0753  MD notified (2nd page):  Time of second page:  Responding MD: Dr. Tommi Rumpse dios  Time MD responded:  [phone]

## 2015-11-18 NOTE — Progress Notes (Signed)
ANTICOAGULATION CONSULT NOTE - Initial Consult  Pharmacy Consult for heparin Indication: NSTEMI, if CT head neg for bleed  Allergies not on file  Patient Measurements: Height: 5\' 3"  (160 cm) Weight: 178 lb 5.6 oz (80.9 kg) IBW/kg (Calculated) : 52.4 Heparin Dosing Weight: 70.1 kg  Vital Signs: Temp: 100.6 F (38.1 C) (05/17 1005) Temp Source: Core (Comment) (05/17 0500) BP: 66/55 mmHg (05/17 1005) Pulse Rate: 94 (05/17 1005)  Labs:  Recent Labs  11/18/15 0643  HGB 11.0*  HCT 32.3*  PLT 64*  LABPROT 14.3  INR 1.09  CREATININE 0.69  CKTOTAL 268*  TROPONINI 6.07*    Estimated Creatinine Clearance: 81.9 mL/min (by C-G formula based on Cr of 0.69).   Medical History: Past Medical History  Diagnosis Date  . Hypertension   . Depression   . Alcohol abuse   . Cholelithiasis     Assessment: [age] yof with seizure-like activity on admit. Pharmacy consulted to dose heparin for NSTEMI if CT head neg for bleed. Troponin rising. Unable to obtain a med rec hx but previous notes do not mention AC pta. HG 11, plt 64. CT of head is negative for bleed. Start heparin with no bolus per CCM.  Currently on SQ heparin - last dose this AM at ~0630.  Goal of Therapy:  Heparin level 0.3-0.7 units/ml Monitor platelets by anticoagulation protocol: Yes   Plan:  D/c SQ heparin No bolus per CCM Start heparin at 850 units/h 6h HL Daily HL/CBC Mon s/sx bleeding   Babs BertinHaley Zyen Triggs, PharmD, [facility] BridgewayBCPS Clinical Pharmacist Pager [phone] 11/18/2015 10:43 AM

## 2015-11-18 NOTE — Progress Notes (Signed)
  Echocardiogram 2D Echocardiogram has been performed.  Janalyn HarderWest, Laquetta Racey R 11/18/2015, 2:35 PM

## 2015-11-18 NOTE — Progress Notes (Signed)
  Pharmacy Antibiotic Note  Abigail HutchingSusan Hull is a 55 y.o. female admitted on 11/18/2015 with pneumonia.  Pharmacy has been consulted for Unasyn dosing. Tmax/24h 100.6, wbc wnl, LA 0.58. CrCl~81  Plan: Unasyn 3g IV q8h Monitor clinical progress, c/s, renal function, abx plan/LOT  Height: 5\' 3"  (160 cm) Weight: 178 lb 5.6 oz (80.9 kg) IBW/kg (Calculated) : 52.4  Temp (24hrs), Avg:100.5 F (38.1 C), Min:100.4 F (38 C), Max:100.6 F (38.1 C)   Recent Labs Lab 11/18/15 0643  WBC 5.3  CREATININE 0.69  LATICACIDVEN 0.8    Estimated Creatinine Clearance: 81.9 mL/min (by C-G formula based on Cr of 0.69).    Allergies not on file  Antimicrobials this admission: 5/17 Unasyn >>   Dose adjustments this admission:   Microbiology results: 5/17 BCx:  5/17 Sputum: 5/17 MRSA PCR:  Babs BertinHaley Shivaun Bilello, PharmD, [facility] Bucks CountyBCPS Clinical Pharmacist Pager [phone] 11/18/2015 10:53 AM

## 2015-11-18 NOTE — Progress Notes (Signed)
RT note-EEG performed with diprovan off, patient is very restless, RN to start diprovan again.

## 2015-11-18 NOTE — Progress Notes (Signed)
EEG Completed; Results Pending  

## 2015-11-18 NOTE — Progress Notes (Signed)
ANTICOAGULATION CONSULT NOTE - Follow-up Consult  Pharmacy Consult for heparin Indication: NSTEMI  Not on File  Patient Measurements: Height: 5\' 3"  (160 cm) Weight: 178 lb 5.6 oz (80.9 kg) IBW/kg (Calculated) : 52.4 Heparin Dosing Weight: 70.1 kg  Vital Signs: Temp: 100.6 F (38.1 C) (05/17 2145) Temp Source: Core (Comment) (05/17 2000) BP: 128/81 mmHg (05/17 2145) Pulse Rate: 68 (05/17 2145)  Labs:  Recent Labs  11/18/15 0643 11/18/15 1200 11/18/15 1839 11/18/15 2135  HGB 11.0*  --   --   --   HCT 32.3*  --   --   --   PLT 64*  --   --   --   APTT  --  31  --   --   LABPROT 14.3  --   --   --   INR 1.09  --   --   --   HEPARINUNFRC  --   --   --  0.27*  CREATININE 0.69  --   --   --   CKTOTAL 268* 252*  --   --   CKMB  --  27.0*  --   --   TROPONINI 6.07* 3.26* 1.90*  --     Estimated Creatinine Clearance: 81.9 mL/min (by C-G formula based on Cr of 0.69).   Assessment: 54 yof on heparin for NSTEMI. Heparin level slightly subtherapeutic on 850 units/hr. Trop peak 6.07, now trending down. No issues with line or bleeding reported per RN. Baseline plt 64 - will need to follow closely.  Goal of Therapy:  Heparin level 0.3-0.7 units/ml Monitor platelets by anticoagulation protocol: Yes   Plan:  Increase heparin to 1000 units/h F/u a.m. heparin level   Christoper Fabianaron Tangelia Sanson, PharmD, BCPS Clinical pharmacist, pager [phone] 11/18/2015 11:05 PM

## 2015-11-18 NOTE — H&P (Signed)
PULMONARY / CRITICAL CARE MEDICINE   Name: Abigail Hull MRN: [id number] DOB: [birth year]    ADMISSION DATE:  11/18/2015 CONSULTATION DATE:  11/18/2015  REFERRING MD:  [facility]  CHIEF COMPLAINT:  Withdrawal/seizure  HISTORY OF PRESENT ILLNESS:   55 year old female past medical history significant for alcohol abuse and hypertension. 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 She typically drinks about 1 pint of hard alcohol daily, and has not had a drink for about 3 days now. She was last seen normal 5/16 at around noon. Since that time she has had significant nausea vomiting, blurry vision, difficulty ambulating, and apparent hallucinations. Her mental status continued to worsen since that time, until her husband witnessed her having a seizure-like episode and called EMS.  Upon arrival to [facility] emergency department she was unresponsive and emergently intubated for airway protection. CT of the head showed bilateral areas of bilateral parietal occipital foci of poorly defined hypoattenuation. Ammonia level was also elevated. She was transferred to [facility] for ICU admission.  PAST MEDICAL HISTORY :  She  has a past medical history of Hypertension; Depression; Alcohol abuse; and Cholelithiasis.  PAST SURGICAL HISTORY: She  has no past surgical history on file.  Limited. No family around.   Allergies not on file  No current facility-administered medications on file prior to encounter.   No current outpatient prescriptions on file prior to encounter.    FAMILY HISTORY:  Her has no family status information on file.   Limited as no family around.   SOCIAL HISTORY: She  is married. Husband was unavailable. Limited as no family around.   REVIEW OF SYSTEMS:   Unable  SUBJECTIVE:  Pt's BP was 70-80 systolic on transfer to [facility]. Propofol was dc'd and started on precedex drip. Started  moving around and agitated. Switched back to propofol and neo was started.   Comfortable.   VITAL SIGNS: BP 92/68 mmHg  Pulse 81  Temp(Src) 100.6 F (38.1 C) (Core (Comment))  Resp 18  Ht  (1.6 m)  Wt 80.9 kg (178 lb 5.6 oz)  BMI 31.60 kg/m2  SpO2 98%  HEMODYNAMICS:    VENTILATOR SETTINGS: Vent Mode:  [-]  FiO2 (%):  [40 %] 40 %  INTAKE / OUTPUT:    PHYSICAL EXAMINATION: General:  Overweight female in NAD on vent Neuro:  Spontaneously awake on vent and following commands HEENT:  [place]/AT, PERRL, no JVD Cardiovascular:  RRR, no MRG Lungs:  Clear bilateral breath sounds Abdomen:  Soft, non-tender, non-distended Musculoskeletal: No acute deformity Skin:  Grossly intact  LABS:  BMET No results for input(s): NA, K, CL, CO2, BUN, CREATININE, GLUCOSE in the last 168 hours.  Electrolytes No results for input(s): CALCIUM, MG, PHOS in the last 168 hours.  CBC No results for input(s): WBC, HGB, HCT, PLT in the last 168 hours.  Coag's No results for input(s): APTT, INR in the last 168 hours.  Sepsis Markers No results for input(s): LATICACIDVEN, PROCALCITON, O2SATVEN in the last 168 hours.  ABG No results for input(s): PHART, PCO2ART, PO2ART in the last 168 hours.  Liver Enzymes No results for input(s): AST, ALT, ALKPHOS, BILITOT, ALBUMIN in the last 168 hours.  Cardiac Enzymes No results for input(s): TROPONINI, PROBNP in the last 168 hours.  Glucose No results for input(s): GLUCAP in the last 168 hours.  Imaging No results found.   STUDIES:  CT head 5/16 > likely bilateral subacute parietal-occipital infarction.  CULTURES: Blood culture 5/17 >> Trache asp 5/17 >>  ANTIBIOTICS: Unasyn 5/17  SIGNIFICANT EVENTS: 5/17 seizure at home likely 2/2 etoh withdrawal. Intubated for airway. [facility] ? CVA  LINES/TUBES: ETT 5/17 > R IJ 5/17 >   DISCUSSION:   ASSESSMENT / PLAN:  PULMONARY A: Inability to protect airway in setting of  seizure. Acute hypoxemic respiratory failure 2/2 unable to protect airway, possible asp pna  P:   Full vent support CXR ABG Vent bundle  CARDIOVASCULAR A:  Troponin elevation; demand ischemia H/o HTN  P:  Telemetry monitoring Trend troponin EKG Echo Will need cardiology consultation if troponin climbs Start heparin for NSTEMI once cranial ct scan is (-) for bleed.   RENAL A:   AKI Concern rhabdo > CK was 268.  High AG metabolic acidosis  P:   Cont IVF Needs hydration > will receive 2.5 L NS before starting pressors.   GASTROINTESTINAL A:   Nausea, vomiting Chronic abdominal pain Bloody OGT drainage H/o cholelithiasis   P:   Amylase Lipase NPO CT abdomen Gastroccult testing DIB protonix  HEMATOLOGIC A:   No acute issues  P:  Start heparin drip for ACS/NSTEMI once cranial ct scan is (-) for bleed.  SCDs Assess coags  INFECTIOUS A:   Possible asp pna  P:   Follow WBC and fever curve. Start unasyn. Consider early deescalation if cultures are (-). Check PCT.   ENDOCRINE A:   No acute issues   P:   Follow glucose on chemistry   NEUROLOGIC A:   Acute CVA  Bilateral parietal occipital hypoattenuation on CT  Acute metabolic encephalopathy in setting of EtOH withdrawal, seizure, and hepatic encephalopathy with hyperammoniemia. Rpt ammonia was N.  Seizures in setting of EtOH withdrawal vs CVA  P:   Propofol MRI brain; stat cranial ct scan to check for IC bleed before heparin.  Consult neurology  > I spoke and discussed the case with Dr. Lavon PaganiniNandigam Defer AED therapy to neurology PRN ativan for seizures Lactulose > will hold as rpt  NH3 was N   FAMILY  - Updates: husband and mother in law updated bedside [facility]. No family when AD rounded in am.   - Inter-disciplinary family meet or Palliative Care meeting due by:  5/24   Joneen RoachPaul Hoffman, AGACNP-BC [place] [facility] Pager [phone] or [phone]  11/18/2015 6:27  AM      ATTENDING NOTE / ATTESTATION NOTE :   I have discussed the case with the resident/APP Joneen RoachPaul Hoffman   I agree with the resident/APP's  history, physical examination, assessment, and plans.  I have edited the above note and modified it according to our agreed history, physical examination, assessment and plan.   I have spent 35  minutes of critical care time with this patient today.  Briefly, pt admitted after sze like activity from ETOH withdrawal. Intubated for airway protection. Cranial ct scan with B hypoattenuation in parieto-occipital areas suggesting acute cva. Troponin was 7, now 6. Neurology consulted. Not on abx.   Family :  No family at bedside.    Pollie MeyerJ. Angelo A de Dios, MD 11/18/2015, 10:36 AM [place] [facility] Pager [phone] After 3 pm or if no answer, call [phone]

## 2015-11-19 ENCOUNTER — Inpatient Hospital Stay (HOSPITAL_COMMUNITY): Payer: MEDICAID

## 2015-11-19 DIAGNOSIS — J9601 Acute respiratory failure with hypoxia: Secondary | ICD-10-CM

## 2015-11-19 DIAGNOSIS — J9690 Respiratory failure, unspecified, unspecified whether with hypoxia or hypercapnia: Secondary | ICD-10-CM | POA: Diagnosis present

## 2015-11-19 LAB — BASIC METABOLIC PANEL
ANION GAP: 12 (ref 5–15)
CALCIUM: 7.5 mg/dL — AB (ref 8.9–10.3)
CO2: 23 mmol/L (ref 22–32)
Chloride: 110 mmol/L (ref 101–111)
Creatinine, Ser: 0.77 mg/dL (ref 0.44–1.00)
GFR calc Af Amer: >60 mL/min (ref >60)
Glucose, Bld: 99 mg/dL (ref 65–99)
Potassium: 3.1 mmol/L — ABNORMAL LOW (ref 3.5–5.1)
Sodium: 145 mmol/L (ref 135–145)

## 2015-11-19 LAB — CBC
HCT: 29.4 % — ABNORMAL LOW (ref 36.0–46.0)
HEMOGLOBIN: 9.5 g/dL — AB (ref 12.0–15.0)
MCH: 32.1 pg (ref 26.0–34.0)
MCHC: 32.3 g/dL (ref 30.0–36.0)
MCV: 99.3 fL (ref 78.0–100.0)
Platelets: 68 10*3/uL — ABNORMAL LOW (ref 150–400)
RBC: 2.96 MIL/uL — AB (ref 3.87–5.11)
RDW: 15.1 % (ref 11.5–15.5)
WBC: 4.9 10*3/uL (ref 4.0–10.5)

## 2015-11-19 LAB — BLOOD CULTURE ID PANEL (REFLEXED)
ACINETOBACTER BAUMANNII: NOT DETECTED
CARBAPENEM RESISTANCE: NOT DETECTED
Candida albicans: NOT DETECTED
Candida glabrata: NOT DETECTED
Candida krusei: NOT DETECTED
Candida parapsilosis: NOT DETECTED
Candida tropicalis: NOT DETECTED
ENTEROBACTERIACEAE SPECIES: NOT DETECTED
Enterobacter cloacae complex: NOT DETECTED
Enterococcus species: NOT DETECTED
Escherichia coli: NOT DETECTED
HAEMOPHILUS INFLUENZAE: NOT DETECTED
Klebsiella oxytoca: NOT DETECTED
Klebsiella pneumoniae: NOT DETECTED
LISTERIA MONOCYTOGENES: NOT DETECTED
METHICILLIN RESISTANCE: DETECTED — AB
NEISSERIA MENINGITIDIS: NOT DETECTED
Proteus species: NOT DETECTED
Pseudomonas aeruginosa: NOT DETECTED
SERRATIA MARCESCENS: NOT DETECTED
STAPHYLOCOCCUS AUREUS BCID: NOT DETECTED
STAPHYLOCOCCUS SPECIES: DETECTED — AB
STREPTOCOCCUS PYOGENES: NOT DETECTED
STREPTOCOCCUS SPECIES: NOT DETECTED
Streptococcus agalactiae: NOT DETECTED
Streptococcus pneumoniae: NOT DETECTED
VANCOMYCIN RESISTANCE: NOT DETECTED

## 2015-11-19 LAB — GLUCOSE, CAPILLARY
GLUCOSE-CAPILLARY: 109 mg/dL — AB (ref 65–99)
Glucose-Capillary: 82 mg/dL (ref 65–99)
Glucose-Capillary: 102 mg/dL — ABNORMAL HIGH (ref 65–99)
Glucose-Capillary: 105 mg/dL — ABNORMAL HIGH (ref 65–99)

## 2015-11-19 LAB — PHOSPHORUS
PHOSPHORUS: 2.5 mg/dL (ref 2.5–4.6)
Phosphorus: 1.9 mg/dL — ABNORMAL LOW (ref 2.5–4.6)

## 2015-11-19 LAB — HEMOGLOBIN AND HEMATOCRIT, BLOOD
HEMATOCRIT: 30.9 % — AB (ref 36.0–46.0)
Hemoglobin: 9.9 g/dL — ABNORMAL LOW (ref 12.0–15.0)

## 2015-11-19 LAB — PROCALCITONIN: PROCALCITONIN: 0.25 ng/mL

## 2015-11-19 LAB — POTASSIUM: Potassium: 3.8 mmol/L (ref 3.5–5.1)

## 2015-11-19 LAB — HEPARIN LEVEL (UNFRACTIONATED)
Heparin Unfractionated: 0.4 IU/mL (ref 0.30–0.70)
Heparin Unfractionated: 0.33 IU/mL (ref 0.30–0.70)

## 2015-11-19 LAB — MAGNESIUM: Magnesium: 2.1 mg/dL (ref 1.7–2.4)

## 2015-11-19 MED ORDER — VITAL HIGH PROTEIN PO LIQD
1000.0000 mL | ORAL | Status: DC
  Administered 2015-11-19: 1000 mL
  Administered 2015-11-20: 11:00:00

## 2015-11-19 MED ORDER — CHLORHEXIDINE GLUCONATE CLOTH 2 % EX PADS
6.0000 | MEDICATED_PAD | Freq: Every day | CUTANEOUS | Status: AC
  Administered 2015-11-19 – 2015-11-23 (×5): 6 via TOPICAL

## 2015-11-19 MED ORDER — POTASSIUM CHLORIDE 20 MEQ/15ML (10%) PO SOLN
40.0000 meq | Freq: Once | ORAL | Status: AC
Start: 2015-11-19 — End: 2015-11-19
  Administered 2015-11-19: 40 meq via ORAL
  Filled 2015-11-19: qty 30

## 2015-11-19 MED ORDER — POTASSIUM CHLORIDE CRYS ER 20 MEQ PO TBCR: 20.0000 meq | EXTENDED_RELEASE_TABLET | Freq: Once | ORAL | Status: DC

## 2015-11-19 MED ORDER — PRO-STAT SUGAR FREE PO LIQD
60.0000 mL | Freq: Three times a day (TID) | ORAL | Status: DC
  Administered 2015-11-19 – 2015-11-24 (×9): 60 mL
  Filled 2015-11-19 (×10): qty 60

## 2015-11-19 MED ORDER — DEXTROSE 5 % IV SOLN
30.0000 mmol | Freq: Once | INTRAVENOUS | Status: AC
  Administered 2015-11-19: 30 mmol via INTRAVENOUS
  Filled 2015-11-19: qty 10

## 2015-11-19 MED ORDER — CHLORHEXIDINE GLUCONATE 0.12% ORAL RINSE (MEDLINE KIT): 15.0000 mL | Freq: Two times a day (BID) | OROMUCOSAL | Status: DC

## 2015-11-19 MED ORDER — VITAL HIGH PROTEIN PO LIQD: 1000.0000 mL | ORAL | Status: DC

## 2015-11-19 MED ORDER — ANTISEPTIC ORAL RINSE SOLUTION (CORINZ)
7.0000 mL | OROMUCOSAL | Status: DC
  Administered 2015-11-19 – 2015-11-20 (×13): 7 mL via OROMUCOSAL

## 2015-11-19 MED ORDER — MUPIROCIN 2 % EX OINT
1.0000 "application " | TOPICAL_OINTMENT | Freq: Two times a day (BID) | CUTANEOUS | Status: AC
  Administered 2015-11-19 – 2015-11-23 (×10): 1 via NASAL
  Filled 2015-11-19 (×3): qty 22

## 2015-11-19 MED ORDER — POTASSIUM PHOSPHATES 15 MMOLE/5ML IV SOLN: 40.0000 meq | Freq: Once | INTRAVENOUS | Status: DC

## 2015-11-19 MED ORDER — POTASSIUM CHLORIDE 20 MEQ/15ML (10%) PO SOLN
20.0000 meq | Freq: Once | ORAL | Status: AC
  Administered 2015-11-19: 20 meq via ORAL
  Filled 2015-11-19: qty 15

## 2015-11-19 NOTE — Care Management Note (Signed)
Case Management Note  Patient Details  Name: Abigail HutchingSusan Renier MRN: [id number] Date of Birth: 12/30/1960  Subjective/Objective:    Pt admitted on 11/18/15 s/p seizure activity related to ETOH withdrawal.  PTA, pt resided at home with spouse.                Action/Plan: Pt currently sedated and on ventilator.  Will follow for discharge planning as pt progresses.    Expected Discharge Date:                  Expected Discharge Plan:     In-House Referral:  Clinical Social Work  Discharge planning Services  CM Consult  Post Acute Care Choice:    Choice offered to:     DME Arranged:    DME Agency:     HH Arranged:    HH Agency:     Status of Service:  In process, will continue to follow  Medicare Important Message Given:    Date Medicare IM Given:    Medicare IM give by:    Date Additional Medicare IM Given:    Additional Medicare Important Message give by:     If discussed at Long Length of Stay Meetings, dates discussed:    Additional Comments:  Quintella BatonJulie W. Avani Sensabaugh, RN, BSN  Trauma/Neuro ICU Case Manager [phone]

## 2015-11-19 NOTE — Progress Notes (Signed)
PULMONARY / CRITICAL CARE MEDICINE   Name: Abigail Hull MRN: [id number] DOB: 1960-11-18    ADMISSION DATE:  11/18/2015 CONSULTATION DATE:  11/18/2015  REFERRING MD:  [facility]  CHIEF COMPLAINT:  Withdrawal/seizure  HISTORY OF PRESENT ILLNESS:   55 year old female past medical history significant for alcohol abuse and hypertension. 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 She typically drinks about 1 pint of hard alcohol daily, and has not had a drink for about 3 days now. She was last seen normal 5/16 at around noon. Since that time she has had significant nausea vomiting, blurry vision, difficulty ambulating, and apparent hallucinations. Her mental status continued to worsen since that time, until her husband witnessed her having a seizure-like episode and called EMS.  Upon arrival to [facility] emergency department she was unresponsive and emergently intubated for airway protection. CT of the head showed bilateral areas of bilateral parietal occipital foci of poorly defined hypoattenuation. Ammonia level was also elevated. She was transferred to [facility] for ICU admission.  SUBJECTIVE:  Coffee ground dc per NGT. On low dose neo 2/2 hypotension with propofol. Doing PST.    Comfortable.   VITAL SIGNS: BP 110/77 mmHg  Pulse 62  Temp(Src) 99.5 F (37.5 C) (Core (Comment))  Resp 14  Ht 5\' 3"  (1.6 m)  Wt 82 kg (180 lb 12.4 oz)  BMI 32.03 kg/m2  SpO2 100%  LMP  (LMP Unknown)  HEMODYNAMICS:    VENTILATOR SETTINGS: Vent Mode:  [-] PSV;CPAP FiO2 (%):  [30 %-40 %] 30 % Set Rate:  [18 bmp] 18 bmp Vt Set:  [420 mL] 420 mL PEEP:  [5 cmH20] 5 cmH20 Pressure Support:  [5 cmH20] 5 cmH20  INTAKE / OUTPUT: I/O last 3 completed shifts: In: 6291.7 [I.V.:4881.7; IV Piggyback:1410] Out: 4075 [Urine:3650; Emesis/NG output:425]  PHYSICAL EXAMINATION: General:  Overweight female in NAD on  vent Neuro:  Spontaneously awake on vent and following commands HEENT:  [place]/AT, PERRL, no JVD Cardiovascular:  RRR, no MRG Lungs:  Bibasilar crackles. Fair ae.  Abdomen:  Soft, non-tender, non-distended Musculoskeletal: No acute deformity Skin:  Grossly intact  LABS:  BMET  Recent Labs Lab 11/18/15 0643 11/18/15 1200 11/19/15 0545  NA 139  --  145  K 3.1* 3.2* 3.1*  CL 106  --  110  CO2 20*  --  23  BUN 9  --  <5*  CREATININE 0.69  --  0.77  GLUCOSE 93  --  99    Electrolytes  Recent Labs Lab 11/18/15 0643 11/18/15 1200 11/19/15 0545  CALCIUM 7.3*  --  7.5*  MG 1.9 1.7 2.1  PHOS 2.8 3.0 1.9*    CBC  Recent Labs Lab 11/18/15 0643  WBC 5.3  HGB 11.0*  HCT 32.3*  PLT 64*    Coag's  Recent Labs Lab 11/18/15 0643 11/18/15 1200  APTT  --  31  INR 1.09  --     Sepsis Markers  Recent Labs Lab 11/18/15 0643 11/18/15 1200 11/19/15 0545  LATICACIDVEN 0.8  --   --   PROCALCITON  --  0.27 0.25    ABG  Recent Labs Lab 11/18/15 0631  PHART 7.328*  PCO2ART 38.1  PO2ART 133*    Liver Enzymes  Recent Labs Lab 11/18/15 0643  AST 182*  ALT 51  ALKPHOS 66  BILITOT 1.4*  ALBUMIN 3.0*    Cardiac Enzymes  Recent Labs Lab 11/18/15 0643 11/18/15 1200 11/18/15 1839  TROPONINI 6.07* 3.26* 1.90*    Glucose No results for input(s): GLUCAP in the last 168 hours.  Imaging Ct Head Wo Contrast  11/18/2015  CLINICAL DATA:  Acute encephalopathy. Rule out intracranial hemorrhage before starting heparin. Bilateral parietal occipital hypo attenuation on outside CT per EMR, presumed stroke EXAM: CT HEAD WITHOUT CONTRAST TECHNIQUE: Contiguous axial images were obtained from the base of the skull through the vertex without intravenous contrast. COMPARISON:  None. FINDINGS: Skull and Sinuses:Negative for fracture or destructive process. The visualized mastoids, middle ears, and imaged paranasal sinuses are clear. Advanced right TMJ arthritis. Visualized  orbits: Negative. Brain: There is cortical and subcortical low-density fairly symmetrically in the occipital and parietal regions without superimposed hemorrhage. No high-density vessel sign. Elsewhere normal appearance of the brain. No hydrocephalus. IMPRESSION: Symmetric parietal-occipital cortically based edema or infarct with pattern suggesting posterior reversible encephalopathy syndrome. No superimposed hemorrhage. Electronically Signed   By: Marnee Spring M.D.   On: 11/18/2015 11:55   Ct Abdomen Pelvis W Contrast  11/18/2015  CLINICAL DATA:  Evaluate the source of blood from NG tube, intubated EXAM: CT ABDOMEN AND PELVIS WITH CONTRAST TECHNIQUE: Multidetector CT imaging of the abdomen and pelvis was performed using the standard protocol following bolus administration of intravenous contrast. CONTRAST:  ISOVUE-300 IOPAMIDOL (ISOVUE-300) INJECTION 61% COMPARISON:  None. FINDINGS: Lower chest: Bilateral tiny pleural effusion. There is bilateral lower lobe posterior small atelectasis or infiltrate. Hepatobiliary: Significant fatty infiltration of the liver. Mild hepatomegaly. There is thickening of gallbladder wall up to 6.5 mm. Probable gallbladder sludge. There is a calcified gallstone in gallbladder neck region measures 9.5 mm. No intrahepatic biliary ductal dilatation. No CBD dilatation. Pancreas: Enhanced pancreas is unremarkable. Spleen: Enhanced spleen is unremarkable. Adrenals/Urinary Tract: No adrenal gland mass. Enhanced kidneys are symmetrical in size. No hydronephrosis or hydroureter. Delayed renal images shows bilateral renal symmetrical excretion. Bilateral visualized proximal ureter is unremarkable. There is mild distended urinary bladder. A Foley catheter is noted within urinary bladder. Small amount of air within urinary bladder anteriorly is probable post instrumentation. Stomach/Bowel: There is no gastric outlet obstruction. There is a NG tube in place with tip in proximal stomach  against the lateral gastric wall. There is no evidence of gastric wall hematoma adjacent to tip of the NG tube at. No evidence of intraluminal hematoma. No pericecal inflammation. Terminal ileum is unremarkable. Normal appendix partially visualized in axial image 72. There is no distal colonic obstruction. The right colon descending colon and sigmoid colon is empty collapsed. Some colonic gas noted in transverse colon. No small bowel obstruction. No thickened or dilated small bowel loops. Vascular/Lymphatic: There is no retroperitoneal or mesenteric adenopathy. No aortic aneurysm. Reproductive: The uterus and ovaries are unremarkable. The uterus is small size. No adnexal mass. Other: No ascites or free air. No retroperitoneal or mesenteric hematoma. No inguinal adenopathy. Musculoskeletal: No destructive bony lesions are noted. Degenerative changes are noted lumbar spine. Probable Schmorl's node deformity noted upper endplate of L4 vertebral body. Significant disc space flattening with vacuum disc phenomenon mild anterior spurring and endplate sclerotic changes at L1-L2 and L2-L3 level. Mild upper lumbar levoscoliosis. IMPRESSION: 1. There is significant fatty infiltration of the liver. Mild hepatomegaly. 2. There is thickening of gallbladder wall up to 6.5 mm. Clinical correlation is necessary to exclude cholecystitis. Further correlation with gallbladder ultrasound could be performed as clinically warranted. Probable sludge within gallbladder lumen. There is a calcified gallstone in neck  of the gallbladder measures 9.5 mm. 3. There is NG tube with tip in proximal stomach with tip against the lateral gastric wall. There is no evidence of intraluminal or mural hematoma. 4. Tiny bilateral pleural effusion with bilateral basilar posterior atelectasis or infiltrate. 5. Normal appendix.  No pericecal inflammation. 6. No small bowel obstruction. 7. Mild distended urinary bladder. Small amount of air within urinary  bladder probable post instrumentation. The bladder catheter is noted. 8. Unremarkable uterus and ovaries. 9. Degenerative changes lumbar spine. Electronically Signed   By: Natasha MeadLiviu  Pop M.D.   On: 11/18/2015 12:59   Dg Chest Port 1 View  11/18/2015  CLINICAL DATA:  Status post central line placement EXAM: PORTABLE CHEST 1 VIEW COMPARISON:  11/18/2015 FINDINGS: Endotracheal tube and nasogastric catheter are again seen and stable. A new left jugular central line is noted with the catheter tip in the mid superior vena cava. No pneumothorax is noted. Some crowding of the vascular markings is noted due to a poor inspiratory effort. IMPRESSION: No pneumothorax following central line placement. Electronically Signed   By: Alcide CleverMark  Lukens M.D.   On: 11/18/2015 11:19     STUDIES:  CT head 5/16 > likely bilateral subacute parietal-occipital infarction. CT head 5/17 > Symmetric parietal-occipital cortically based edema or infarct with pattern suggesting posterior reversible encephalopathy syndrome. No superimposed hemorrhage.  CULTURES: Blood culture 5/17 >> Trache asp 5/17 >> MRSA (+) 5/17  ANTIBIOTICS: Unasyn 5/17  SIGNIFICANT EVENTS: 5/17 seizure at home likely 2/2 etoh withdrawal. Intubated for airway. [facility].   LINES/TUBES: ETT 5/17 > R IJ 5/17 >   DISCUSSION:   ASSESSMENT / PLAN:  PULMONARY A: Inability to protect airway in setting of seizure. Acute hypoxemic respiratory failure 2/2 unable to protect airway, possible asp pna. MRSA (+)  P:   Full vent support CXR ABG Vent bundle Start PST.   CARDIOVASCULAR A:  demand ischemia; EF 45-50% H/o HTN  P:  Telemetry monitoring Trend troponin > trending down cont heparin for NSTEMI. If with more bloody NGT output, will hold heparin drip.   RENAL A:   Hypo K, Phos P:   Cont IVF > dec to 50 mls/hr   GASTROINTESTINAL A:   Chronic abdominal pain > ct scan with GB sludge/ possible Cholecystitis H/o  cholelithiasis   P:   Start TF Observe abd.  Check LFTs in am Observe Hb hct with heparin drip > if dropping, will d/c heparin drip.  DIB protonix  HEMATOLOGIC A:   No acute issues  P:  cont heparin drip for ACS/NSTEMI SCDs Assess coags  INFECTIOUS A:   Possible asp pna  P:   Follow WBC and fever curve. cont unasyn. Consider early deescalation if cultures are (-). Check PCT.   ENDOCRINE A:   No acute issues   P:   Follow glucose on chemistry   NEUROLOGIC A:   AMS prob 2/2 PRES Seizures in setting of EtOH withdrawal   P:   Propofol MRI brain >> Consult neurology  > I spoke and discussed the case with Dr. Lavon PaganiniNandigam Defer AED therapy to neurology PRN ativan for seizures   FAMILY  - Updates: husband updated extensively at bedside.   - Inter-disciplinary family meet or Palliative Care meeting due by:  5/24  Critical care time spent on this pt today : 35 minutes.  Pollie MeyerJ. Angelo A de Dios, MD 11/19/2015, 9:31 AM [place] [facility] Pager [phone] After 3 pm  or if no answer, call [phone]

## 2015-11-19 NOTE — Progress Notes (Signed)
  PHARMACY - PHYSICIAN COMMUNICATION CRITICAL VALUE ALERT - BLOOD CULTURE IDENTIFICATION (BCID)  Results for orders placed or performed during the hospital encounter of 11/18/15  Blood Culture ID Panel (Reflexed) (Collected: 11/18/2015 12:00 PM)  Result Value Ref Range   Enterococcus species NOT DETECTED NOT DETECTED   Vancomycin resistance NOT DETECTED NOT DETECTED   Listeria monocytogenes NOT DETECTED NOT DETECTED   Staphylococcus species DETECTED (A) NOT DETECTED   Staphylococcus aureus NOT DETECTED NOT DETECTED   Methicillin resistance DETECTED (A) NOT DETECTED   Streptococcus species NOT DETECTED NOT DETECTED   Streptococcus agalactiae NOT DETECTED NOT DETECTED   Streptococcus pneumoniae NOT DETECTED NOT DETECTED   Streptococcus pyogenes NOT DETECTED NOT DETECTED   Acinetobacter baumannii NOT DETECTED NOT DETECTED   Enterobacteriaceae species NOT DETECTED NOT DETECTED   Enterobacter cloacae complex NOT DETECTED NOT DETECTED   Escherichia coli NOT DETECTED NOT DETECTED   Klebsiella oxytoca NOT DETECTED NOT DETECTED   Klebsiella pneumoniae NOT DETECTED NOT DETECTED   Proteus species NOT DETECTED NOT DETECTED   Serratia marcescens NOT DETECTED NOT DETECTED   Carbapenem resistance NOT DETECTED NOT DETECTED   Haemophilus influenzae NOT DETECTED NOT DETECTED   Neisseria meningitidis NOT DETECTED NOT DETECTED   Pseudomonas aeruginosa NOT DETECTED NOT DETECTED   Candida albicans NOT DETECTED NOT DETECTED   Candida glabrata NOT DETECTED NOT DETECTED   Candida krusei NOT DETECTED NOT DETECTED   Candida parapsilosis NOT DETECTED NOT DETECTED   Candida tropicalis NOT DETECTED NOT DETECTED   [age] YOF with hx EtOH abuse and hypertension who presented on 5/17 with EtOH withdrawal, AMS, and seizures. The patient was started on Unasyn for aspiration PNA coverage.  1/2 BCx are now positive for methicillin resistant - staph species - NOT staph aureus - likely MRSE. The results were discussed with  Dr. Lucy Chrisios - no changes were recommended at this time.   Name of physician (or Provider) Contacted: J. Alexis FrockAngelo A de Dios, MD  Changes to prescribed antibiotics required: None  Rolley SimsMartin, Graycee Greeson Ann 11/19/2015  11:40 AM

## 2015-11-19 NOTE — Progress Notes (Signed)
EEG completed; results pending.    

## 2015-11-19 NOTE — Procedures (Signed)
History: Abigail Hull is an 55 y.o. female patient with altered mental status, PRES on CT brain. Routine inpatient EEG was performed for further evaluation.   Patient Active Problem List   Diagnosis Date Noted  . Respiratory failure ([place])   . Alcohol withdrawal seizure ([place]) 11/18/2015  . Acute encephalopathy   . Encounter for central line placement   . CVA (cerebral infarction)      Current facility-administered medications:  .  0.9 %  sodium chloride infusion, 250 mL, Intravenous, PRN, Corey Harold, NP .  0.9 %  sodium chloride infusion, , Intravenous, Continuous, Jose Angelo A de Larkin Ina, MD, Last Rate: 100 mL/hr at 11/19/15 0700 .  Ampicillin-Sulbactam (UNASYN) 3 g in sodium chloride 0.9 % 100 mL IVPB, 3 g, Intravenous, Q8H, Haley Henderson Newcomer, RPH, 3 g at 11/19/15 0400 .  antiseptic oral rinse solution (CORINZ), 7 mL, Mouth Rinse, QID, Jose Shirl Harris, MD, 7 mL at 11/19/15 0452 .  chlorhexidine gluconate (SAGE KIT) (PERIDEX) 0.12 % solution 15 mL, 15 mL, Mouth Rinse, BID, Jose Angelo A de [place], MD, 15 mL at 11/19/15 0800 .  Chlorhexidine Gluconate Cloth 2 % PADS 6 each, 6 each, Topical, Q0600, Raylene Miyamoto, MD .  feeding supplement (PRO-STAT SUGAR FREE 64) liquid 60 mL, 60 mL, Per Tube, TID, Asencion Islam, RD .  Derrill Memo ON 11/20/2015] feeding supplement (VITAL HIGH PROTEIN) liquid 1,000 mL, 1,000 mL, Per Tube, Q24H, Asencion Islam, RD .  fentaNYL (SUBLIMAZE) injection 100 mcg, 100 mcg, Intravenous, Q15 min PRN, Corey Harold, NP, 100 mcg at 11/18/15 1946 .  fentaNYL (SUBLIMAZE) injection 100 mcg, 100 mcg, Intravenous, Q2H PRN, Corey Harold, NP .  heparin ADULT infusion 100 units/mL (25000 units/250 mL), 1,000 Units/hr, Intravenous, Continuous, Franky Macho, RPH, Last Rate: 10 mL/hr at 11/19/15 0700, 1,000 Units/hr at 11/19/15 0700 .  levETIRAcetam (KEPPRA) 1,000 mg in sodium chloride 0.9 % 100 mL IVPB, 1,000 mg, Intravenous, Q12H, Cylah Fannin Fuller Mandril, MD, 1,000 mg at  11/19/15 0037 .  LORazepam (ATIVAN) injection 2 mg, 2 mg, Intravenous, Q5 min PRN, Corey Harold, NP .  mupirocin ointment (BACTROBAN) 2 % 1 application, 1 application, Nasal, BID, Raylene Miyamoto, MD .  pantoprazole (PROTONIX) injection 40 mg, 40 mg, Intravenous, Q12H, Corey Harold, NP, 40 mg at 11/19/15 0911 .  phenylephrine (NEO-SYNEPHRINE) 40 mg in dextrose 5 % 250 mL (0.16 mg/mL) infusion, 30-200 mcg/min, Intravenous, Continuous, Raylene Miyamoto, MD, Last Rate: 18.8 mL/hr at 11/19/15 0930, 50 mcg/min at 11/19/15 0930 .  potassium chloride 20 MEQ/15ML (10%) solution 20 mEq, 20 mEq, Oral, Once, [employer], MD .  potassium chloride 20 MEQ/15ML (10%) solution 40 mEq, 40 mEq, Oral, Once, [employer], MD .  propofol (DIPRIVAN) 1000 MG/100ML infusion, 5-70 mcg/kg/min, Intravenous, Titrated, Jose Angelo A de [place], MD, Last Rate: 9.7 mL/hr at 11/19/15 0800, 20 mcg/kg/min at 11/19/15 0800 .  sodium phosphate 30 mmol in dextrose 5 % 250 mL infusion, 30 mmol, Intravenous, Once, Jose Shirl Harris, MD   Introduction:  This is a 19 channel routine scalp EEG performed at the bedside with bipolar and monopolar montages arranged in accordance to the international 10/20 system of electrode placement. One channel was dedicated to EKG recording.   Findings:  The background rhythm was 10-12 Hz alpha . Frequent right frontal abnormal epileptiform discharges in the form of sharps with phase reversal and right hemispheric  field were noted, without spread to left hemisphere. No definite evidence of electrographic seizures were noted during this recording.   Impression:  Abnormal routine inpatient EEG suggestive of neuronal dysfunction with epileptogenic potential in right frontal lobe as described. No seizures noted. Clinical correlation is recommended .

## 2015-11-19 NOTE — Progress Notes (Signed)
RT NOTE:  Vent switched to full support for overnight rest

## 2015-11-19 NOTE — Progress Notes (Signed)
Initial Nutrition Assessment  DOCUMENTATION CODES:   Obesity unspecified  INTERVENTION:  -Initiate tube feeding via NGT with Vital High Protein @ goal rate of 10 ml/hr (240 ml/d) with 60 ml prostat TID.  -Tube feeding regimen + propofol will provide 1336 kcals and 111 g of protein (meets 100% needs).    NUTRITION DIAGNOSIS:   Inadequate oral intake related to inability to eat as evidenced by NPO status.  GOAL:   Provide needs based on ASPEN/SCCM guidelines  MONITOR:   Vent status, Labs, Weight trends, Skin, I & O's  REASON FOR ASSESSMENT:   Consult Enteral/tube feeding initiation and management  ASSESSMENT:   Pt with alcohol abuse and HTN. Drinks 1 pint of hard alcohol daily but none for past 3 days. She has had significant nausea vomiting, blurry vision, difficulty ambulating, and apparent hallucinations since not drinking. Had seizure at home likely d/t etoh withdrawal. [facility] Potential CVA.   Weight up 2 lbs in past 24 hours. Pt is +2 L since admission.  Pt remains on vent.  Temp (24hrs), Avg:100.4 F (38 C), Min:98.8 F (37.1 C), Max:101.8 F (38.8 C) Propofol @ 18.8 ml/hr (496 kcals) Spoke with RN about pt. Reports fluctuations in propofol this am but should be maintained at 18.8 ml/hr for now.  Bloody NGT drainage.  Abdominal pain with bloody NGT drainage and h/o cholelithiasis.   NFPE: no fat depletion, no muscle depletion, no edema. Labs reviewed; K 3.1, BUN <5, Ca 7.5, phos 1.9 Meds reviewed; KCl, Na Phos.   Diet Order:  Diet NPO time specified  Skin:  Reviewed, no issues  Last BM:  unknown  Height:   Ht Readings from Last 1 Encounters:  11/18/15 5\' 3"  (1.6 m)    Weight:   Wt Readings from Last 1 Encounters:  11/19/15 180 lb 12.4 oz (82 kg)    Ideal Body Weight:  52.3 kg  BMI:  Body mass index is 32.03 kg/(m^2).  Estimated Nutritional Needs:   Kcal:  [phone]  Protein:  105  g  Fluid:  per MD  EDUCATION NEEDS:   No education needs identified at this time  Beryle QuantMeredith Celestino Ackerman, MS [facility] Dietetic Intern Pager [phone]

## 2015-11-19 NOTE — Progress Notes (Signed)
ANTICOAGULATION CONSULT NOTE - Follow-up Consult  Pharmacy Consult for heparin Indication: NSTEMI  Not on File  Patient Measurements: Height: 5\' 3"  (160 cm) Weight: 180 lb 12.4 oz (82 kg) IBW/kg (Calculated) : 52.4 Heparin Dosing Weight: 70.1 kg  Vital Signs: Temp: 99.5 F (37.5 C) (05/18 0915) Temp Source: Core (Comment) (05/18 0800) BP: 111/82 mmHg (05/18 1209) Pulse Rate: 77 (05/18 1209)  Labs:  Recent Labs  11/18/15 0643 11/18/15 1200 11/18/15 1839 11/18/15 2135 11/19/15 0545 11/19/15 1050  HGB 11.0*  --   --   --   --  9.5*  HCT 32.3*  --   --   --   --  29.4*  PLT 64*  --   --   --   --  68*  APTT  --  31  --   --   --   --   LABPROT 14.3  --   --   --   --   --   INR 1.09  --   --   --   --   --   HEPARINUNFRC  --   --   --  0.27* 0.40 0.33  CREATININE 0.69  --   --   --  0.77  --   CKTOTAL 268* 252*  --   --   --   --   CKMB  --  27.0*  --   --   --   --   TROPONINI 6.07* 3.26* 1.90*  --   --   --     Estimated Creatinine Clearance: 82.4 mL/min (by C-G formula based on Cr of 0.77).   Assessment: 54 yof on Heparin for NSTEMI. Troponin peaked at 6.07, now down to 1.9. CT head neg for bleed. No anticoag PTA. FOBT+. Last HL therapeutic at 0.4 after rate increase. Hgb 11.0, plts low at 64. Watch s/s of bleed closely.  Goal of Therapy:  Heparin level 0.3-0.7 units/ml Monitor platelets by anticoagulation protocol: Yes   Plan:  Continue heparin gtt at 1,000 units/hr Check 6 hr confirmatory HL Monitor daily HL, CBC, s/s of bleed  ADDENDUM:  Confirmatory HL is therapeutic but slightly down to 0.33.  Plan: Increase heparin gtt slightly to 1,100 units/hr Monitor daily HL, CBC, s/s of bleed  Enzo BiNathan Amaris Garrette, PharmD, [facility] Clinical Pharmacist Pager [phone] 11/19/2015 12:15 PM

## 2015-11-19 NOTE — Progress Notes (Signed)
ANTICOAGULATION CONSULT NOTE - Follow-up Consult  Pharmacy Consult for heparin Indication: NSTEMI  Not on File  Patient Measurements: Height: 5\' 3"  (160 cm) Weight: 180 lb 12.4 oz (82 kg) IBW/kg (Calculated) : 52.4 Heparin Dosing Weight: 70.1 kg  Vital Signs: Temp: 99.7 F (37.6 C) (05/18 0700) Temp Source: Core (Comment) (05/18 0600) BP: 109/85 mmHg (05/18 0700) Pulse Rate: 56 (05/18 0700)  Labs:  Recent Labs  11/18/15 0643 11/18/15 1200 11/18/15 1839 11/18/15 2135 11/19/15 0545  HGB 11.0*  --   --   --   --   HCT 32.3*  --   --   --   --   PLT 64*  --   --   --   --   APTT  --  31  --   --   --   LABPROT 14.3  --   --   --   --   INR 1.09  --   --   --   --   HEPARINUNFRC  --   --   --  0.27* 0.40  CREATININE 0.69  --   --   --  0.77  CKTOTAL 268* 252*  --   --   --   CKMB  --  27.0*  --   --   --   TROPONINI 6.07* 3.26* 1.90*  --   --     Estimated Creatinine Clearance: 82.4 mL/min (by C-G formula based on Cr of 0.77).   Assessment: Abigail Hull on Heparin for NSTEMI. Troponin peaked at 6.07, now down to 1.9. CT head neg for bleed. No anticoag PTA. FOBT+. Last HL therapeutic at 0.4 after rate increase. Hgb 11.0, plts low at 64. Watch s/s of bleed closely.  Goal of Therapy:  Heparin level 0.3-0.7 units/ml Monitor platelets by anticoagulation protocol: Yes   Plan:  Continue heparin gtt at 1,000 units/hr Check 6 hr confirmatory HL Monitor daily HL, CBC, s/s of bleed  Enzo BiNathan Gurkirat Basher, PharmD, [facility] Clinical Pharmacist Pager [phone] 11/19/2015 7:42 AM

## 2015-11-20 ENCOUNTER — Inpatient Hospital Stay (HOSPITAL_COMMUNITY): Payer: Self-pay

## 2015-11-20 ENCOUNTER — Encounter (HOSPITAL_COMMUNITY): Payer: Self-pay | Admitting: *Deleted

## 2015-11-20 DIAGNOSIS — I6783 Posterior reversible encephalopathy syndrome: Secondary | ICD-10-CM | POA: Diagnosis present

## 2015-11-20 LAB — CBC
HEMATOCRIT: 31.4 % — AB (ref 36.0–46.0)
Hemoglobin: 9.8 g/dL — ABNORMAL LOW (ref 12.0–15.0)
MCH: 31.6 pg (ref 26.0–34.0)
MCHC: 31.2 g/dL (ref 30.0–36.0)
MCV: 101.3 fL — AB (ref 78.0–100.0)
Platelets: 94 10*3/uL — ABNORMAL LOW (ref 150–400)
RBC: 3.1 MIL/uL — ABNORMAL LOW (ref 3.87–5.11)
RDW: 15.6 % — AB (ref 11.5–15.5)
WBC: 6.7 10*3/uL (ref 4.0–10.5)

## 2015-11-20 LAB — BASIC METABOLIC PANEL
ANION GAP: 10 (ref 5–15)
BUN: 8 mg/dL (ref 6–20)
CALCIUM: 8 mg/dL — AB (ref 8.9–10.3)
CO2: 25 mmol/L (ref 22–32)
Chloride: 111 mmol/L (ref 101–111)
Creatinine, Ser: 0.61 mg/dL (ref 0.44–1.00)
GFR calc Af Amer: >60 mL/min (ref >60)
GLUCOSE: 107 mg/dL — AB (ref 65–99)
Potassium: 3.2 mmol/L — ABNORMAL LOW (ref 3.5–5.1)
SODIUM: 146 mmol/L — AB (ref 135–145)

## 2015-11-20 LAB — C DIFFICILE QUICK SCREEN W PCR REFLEX
C DIFFICILE (CDIFF) INTERP: NEGATIVE
C DIFFICILE (CDIFF) TOXIN: NEGATIVE
C Diff antigen: NEGATIVE

## 2015-11-20 LAB — HEPATIC FUNCTION PANEL
ALK PHOS: 71 U/L (ref 38–126)
ALT: 35 U/L (ref 14–54)
AST: 74 U/L — ABNORMAL HIGH (ref 15–41)
Albumin: 2.5 g/dL — ABNORMAL LOW (ref 3.5–5.0)
BILIRUBIN DIRECT: 0.4 mg/dL (ref 0.1–0.5)
BILIRUBIN INDIRECT: 0.4 mg/dL (ref 0.3–0.9)
BILIRUBIN TOTAL: 0.8 mg/dL (ref 0.3–1.2)
Total Protein: 5.1 g/dL — ABNORMAL LOW (ref 6.5–8.1)

## 2015-11-20 LAB — GLUCOSE, CAPILLARY
GLUCOSE-CAPILLARY: 95 mg/dL (ref 65–99)
GLUCOSE-CAPILLARY: 95 mg/dL (ref 65–99)
GLUCOSE-CAPILLARY: 92 mg/dL (ref 65–99)
GLUCOSE-CAPILLARY: 82 mg/dL (ref 65–99)
Glucose-Capillary: 99 mg/dL (ref 65–99)
Glucose-Capillary: 73 mg/dL (ref 65–99)

## 2015-11-20 LAB — MAGNESIUM: Magnesium: 1.8 mg/dL (ref 1.7–2.4)

## 2015-11-20 LAB — PHOSPHORUS: Phosphorus: 2.5 mg/dL (ref 2.5–4.6)

## 2015-11-20 LAB — HEPARIN LEVEL (UNFRACTIONATED): Heparin Unfractionated: 0.51 IU/mL (ref 0.30–0.70)

## 2015-11-20 LAB — PROCALCITONIN: Procalcitonin: 0.17 ng/mL

## 2015-11-20 MED ORDER — GADOBENATE DIMEGLUMINE 529 MG/ML IV SOLN
17.0000 mL | Freq: Once | INTRAVENOUS | Status: AC | PRN
  Administered 2015-11-20: 17 mL via INTRAVENOUS

## 2015-11-20 MED ORDER — VANCOMYCIN HCL 10 G IV SOLR
1750.0000 mg | Freq: Once | INTRAVENOUS | Status: AC
  Administered 2015-11-20: 1750 mg via INTRAVENOUS
  Filled 2015-11-20: qty 1750

## 2015-11-20 MED ORDER — HEPARIN SODIUM (PORCINE) 5000 UNIT/ML IJ SOLN
5000.0000 [IU] | Freq: Three times a day (TID) | INTRAMUSCULAR | Status: DC
  Administered 2015-11-20 – 2015-11-24 (×11): 5000 [IU] via SUBCUTANEOUS
  Filled 2015-11-20 (×12): qty 1

## 2015-11-20 MED ORDER — DEXTROSE 5 % IV SOLN
INTRAVENOUS | Status: DC
  Administered 2015-11-20 – 2015-11-21 (×2): via INTRAVENOUS

## 2015-11-20 MED ORDER — VANCOMYCIN HCL IN DEXTROSE 1-5 GM/200ML-% IV SOLN: 1000.0000 mg | Freq: Two times a day (BID) | INTRAVENOUS | Status: DC

## 2015-11-20 MED ORDER — DEXMEDETOMIDINE HCL IN NACL 200 MCG/50ML IV SOLN
0.4000 ug/kg/h | INTRAVENOUS | Status: DC
  Administered 2015-11-20: 0.4 ug/kg/h via INTRAVENOUS
  Filled 2015-11-20: qty 50

## 2015-11-20 MED ORDER — ASPIRIN EC 81 MG PO TBEC
81.0000 mg | DELAYED_RELEASE_TABLET | Freq: Every day | ORAL | Status: DC
  Administered 2015-11-20 – 2015-11-24 (×5): 81 mg via ORAL
  Filled 2015-11-20 (×6): qty 1

## 2015-11-20 NOTE — Progress Notes (Signed)
eLink Physician-Brief Progress Note Patient Name: Abigail HutchingSusan Echeverri DOB: 1960-09-30 MRN: [id number]   Date of Service  11/20/2015  HPI/Events of Note  Falling blood sugars. On last check was 73. Pt NPO after extubation  eICU Interventions  Start D5W @50 /hr     Intervention Category Minor Interventions: Other:  Dawud Mays 11/20/2015, 9:20 PM

## 2015-11-20 NOTE — Progress Notes (Addendum)
PULMONARY / CRITICAL CARE MEDICINE   Name: Abigail Hull MRN: [id number] DOB: 1961-02-15    ADMISSION DATE:  11/18/2015 CONSULTATION DATE:  11/18/2015  [facility]  [facility]  CHIEF COMPLAINT:  Withdrawal/seizure  HISTORY OF PRESENT ILLNESS:   55 year old female past medical history significant for alcohol abuse and hypertension. 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 She typically drinks about 1 pint of hard alcohol daily, and has not had a drink for about 3 days now. She was last seen normal 5/16 at around noon. Since that time she has had significant nausea vomiting, blurry vision, difficulty ambulating, and apparent hallucinations. Her mental status continued to worsen since that time, until her husband witnessed her having a seizure-like episode and called EMS.  [facility] she was unresponsive and emergently intubated for airway protection. CT of the head showed bilateral areas of bilateral parietal occipital foci of poorly defined hypoattenuation. Ammonia level was also elevated. [facility]  SUBJECTIVE:  Coffee ground dc per NGT. On low dose neo 2/2 hypotension with propofol. Doing PST.  Awake but does not follow commands purposefully. With loose stools overnight.    Comfortable.   VITAL SIGNS: BP 114/76 mmHg  Pulse 73  Temp(Src) 100.2 F (37.9 C) (Core (Comment))  Resp 15  Ht 5\' 3"  (1.6 m)  Wt 84.5 kg (186 lb 4.6 oz)  BMI 33.01 kg/m2  SpO2 100%  LMP  (LMP Unknown)  HEMODYNAMICS:    VENTILATOR SETTINGS: Vent Mode:  [-] PSV FiO2 (%):  [30 %] 30 % Set Rate:  [18 bmp] 18 bmp Vt Set:  [420 mL] 420 mL PEEP:  [5 cmH20] 5 cmH20 Pressure Support:  [5 cmH20-8 cmH20] 5 cmH20 Plateau Pressure:  [16 cmH20-17 cmH20] 17 cmH20  INTAKE / OUTPUT: I/O last 3 completed shifts: In: 5691.9 [I.V.:4590.4;  NG/GT:111.5; IV Piggyback:990] Out: 3310 [Urine:3135; Emesis/NG output:175]  PHYSICAL EXAMINATION: General:  Overweight female in NAD on vent Neuro:  Spontaneously awake on vent and not following commands purposefully HEENT:  [place]/AT, PERRL, no JVD Cardiovascular:  RRR, no MRG Lungs:  Bibasilar crackles. Fair ae.  Abdomen:  Soft, non-tender, non-distended Musculoskeletal: No acute deformity Skin:  Grossly intact  LABS:  BMET  Recent Labs Lab 11/18/15 0643  11/19/15 0545 11/19/15 1350 11/20/15 0610  NA 139  --  145  --  146*  K 3.1*  < > 3.1* 3.8 3.2*  CL 106  --  110  --  111  CO2 20*  --  23  --  25  BUN 9  --  <5*  --  8  CREATININE 0.69  --  0.77  --  0.61  GLUCOSE 93  --  99  --  107*  < > = values in this interval not displayed.  Electrolytes  Recent Labs Lab 11/18/15 0643 11/18/15 1200 11/19/15 0545 11/19/15 1350 11/20/15 0610  CALCIUM 7.3*  --  7.5*  --  8.0*  MG 1.9 1.7 2.1  --  1.8  PHOS 2.8 3.0 1.9* 2.5 2.5    CBC  Recent Labs Lab 11/18/15 0643 11/19/15 1050 11/19/15 1700 11/20/15 0610  WBC 5.3 4.9  --  6.7  HGB 11.0* 9.5* 9.9* 9.8*  HCT 32.3* 29.4* 30.9* 31.4*  PLT 64* 68*  --  94*    Coag's  Recent Labs  Lab 11/18/15 0643 11/18/15 1200  APTT  --  31  INR 1.09  --     Sepsis Markers  Recent Labs Lab 11/18/15 0643 11/18/15 1200 11/19/15 0545 11/20/15 0610  LATICACIDVEN 0.8  --   --   --   PROCALCITON  --  0.27 0.25 0.17    ABG  Recent Labs Lab 11/18/15 0631  PHART 7.328*  PCO2ART 38.1  PO2ART 133*    Liver Enzymes  Recent Labs Lab 11/18/15 0643 11/20/15 0610  AST 182* 74*  ALT 51 35  ALKPHOS 66 71  BILITOT 1.4* 0.8  ALBUMIN 3.0* 2.5*    Cardiac Enzymes  Recent Labs Lab 11/18/15 0643 11/18/15 1200 11/18/15 1839  TROPONINI 6.07* 3.26* 1.90*    Glucose  Recent Labs Lab 11/19/15 1111 11/19/15 1551 11/19/15 1955 11/19/15 2327 11/20/15 0404 11/20/15 0734  GLUCAP 82 109* 102* 105* 95 99     Imaging Dg Chest Port 1 View  11/20/2015  CLINICAL DATA:  Respiratory failure. EXAM: PORTABLE CHEST 1 VIEW COMPARISON:  11/18/2015. FINDINGS: Endotracheal tube, left IJ line, NG tube in stable position. Left lower lobe infiltrate and/or atelectasis. Small left pleural effusion. No pneumothorax . IMPRESSION: 1. Lines and tubes in stable position. 2. Left lower lobe infiltrate and/or atelectasis. Small left pleural effusion. Electronically Signed   By: Maisie Fus  Register   On: 11/20/2015 07:37     STUDIES:  CT head 5/16 > likely bilateral subacute parietal-occipital infarction. CT head 5/17 > Symmetric parietal-occipital cortically based edema or infarct with pattern suggesting posterior reversible encephalopathy syndrome. No superimposed hemorrhage.  CULTURES: Blood culture 5/19 >> Blood culture 5/17 > MR staph species in 1 bottle; G(+) cocci in another Trache asp 5/17 >> some G(+) cocci MRSA (+) 5/17  ANTIBIOTICS: Unasyn 5/17 > 5/19 Vanc 5/19 >   SIGNIFICANT EVENTS: 5/17 seizure at home likely 2/2 etoh withdrawal. Intubated for airway. Transferred to [facility]. CT scan c/w PRES.   LINES/TUBES: ETT 5/17 > R IJ 5/17 >   DISCUSSION:   ASSESSMENT / PLAN:  PULMONARY A: Inability to protect airway in setting of seizure. Acute hypoxemic respiratory failure 2/2 unable to protect airway, possible asp pna. MRSA (+); with staph in blood and G(+) cocci in trache asp  P:   Full vent support Daily PST > awaiting to wake up fully. ETOH withdrawal D/c unasyn. Start vanc as she is  MRSA (+), MR staph species in blood, another blood culture with G(+) cocci,  and G(+) cocci in trache.   CARDIOVASCULAR A:  Demand ischemia; EF 45-50%; Echo with akinesis of inferior wall H/o HTN  P:  Telemetry monitoring Trend troponin > trending down Start ASA > observe plt Will d/c heparin. (recent mild UGIB) Will need Cards evaluation for demand ischemia, low N EF.  RENAL A:   Hypo K, Phos P:    Correct electrolytes Cont TF and IVF (having diarrhea) > d/c IVF if diarrhea is better  GASTROINTESTINAL A:   Chronic abdominal pain > ct scan with GB sludge/ possible Cholecystitis H/o cholelithiasis  Diarrhea (post starting TF) Likely with mild UGIB 2/2 stress gastritis/PUD (had coffee ground NGT output on admit)  P:   Observe diarrhea. Cont TF and IVF for now LFTs are better.  DIB protonix Observe hb/Hct.   HEMATOLOGIC A:   No acute issues Mild UGIB likely 2/2 stress gastritis/PUD  P:  Will d/c heparin drip SCDs   INFECTIOUS A:   Possible asp pna. MRSA (+) and MR Staph species  in 1 blood culture and G(+) cocci in another blood culture and trache.   P:   Follow WBC and fever curve. As discussed above. Will switch abx to vanc.  Rpt blood culture today.   ENDOCRINE A:   No acute issues   P:   Follow glucose on chemistry   NEUROLOGIC A:   AMS prob 2/2 PRES Seizures in setting of EtOH withdrawal   P:   Propofol > try to wean off for PST MRI brain >> Consult neurology  > I spoke and discussed the case with Dr. Lavon Paganini Defer AED therapy to neurology PRN ativan for seizures   FAMILY  - Updates: No family at bedside.   - Inter-disciplinary family meet or Palliative Care meeting due by:  5/24  Critical care time spent on this pt today : 35 minutes.  Pollie Meyer, MD 11/20/2015, 9:43 AM [facility] Pager [phone] After 3 pm or if no answer, call [phone]   Addendum 11/20/15 11:05am Pt eventually woke up off sedation and neo drip. Doing well on PST. Good volumes. (-) lateralizing signs. Follows commands. Husband updated extensively at bedside.  Plan to extubate. May need BipaP.  Pollie Meyer, MD 11/20/2015, 11:07 AM [facility] Pager [phone] After 3 pm or if no answer, call [phone]

## 2015-11-20 NOTE — Progress Notes (Signed)
Interval History:                                                                                                                      Abigail Hull is an 55 y.o. female patient with  alcohol abuse history, extubated today. She is following commands appropriately, denies any neurological symptoms.   Past Medical History: Past Medical History  Diagnosis Date  . Hypertension   . Depression   . Alcohol abuse   . Cholelithiasis   . Hyperlipemia     History reviewed. No pertinent past surgical history.  Family History: History reviewed. No pertinent family history.  Social History:   reports that she has been smoking E-cigarettes.  She has never used smokeless tobacco. She reports that she drinks about 42.0 oz of alcohol per week. Her drug history is not on file.  Allergies:  No Known Allergies   Medications:                                                                                                                         Current facility-administered medications:  .  0.9 %  sodium chloride infusion, 250 mL, Intravenous, PRN, Corey Harold, NP .  0.9 %  sodium chloride infusion, , Intravenous, Continuous, Jose Shirl Harris, MD, Last Rate: 50 mL/hr at 11/20/15 1528 .  Ampicillin-Sulbactam (UNASYN) 3 g in sodium chloride 0.9 % 100 mL IVPB, 3 g, Intravenous, Q8H, Romona Curls, RPH, 3 g at 11/20/15 1842 .  antiseptic oral rinse solution (CORINZ), 7 mL, Mouth Rinse, 10 times per day, Raylene Miyamoto, MD, 7 mL at 11/20/15 1727 .  aspirin EC tablet 81 mg, 81 mg, Oral, Daily, [address] MD, 81 mg at 11/20/15 0943 .  chlorhexidine gluconate (SAGE KIT) (PERIDEX) 0.12 % solution 15 mL, 15 mL, Mouth Rinse, BID, Jose Shirl Harris, MD, 15 mL at 11/20/15 [phone] .  Chlorhexidine Gluconate Cloth 2 % PADS 6 each, 6 each, Topical, Q0600, Raylene Miyamoto, MD, 6 each at 11/20/15 0600 .  dexmedetomidine (PRECEDEX) 200 MCG/50ML (4 mcg/mL) infusion, 0.4-1.2 mcg/kg/hr,  Intravenous, Titrated, Jose Shirl Harris, MD, Stopped at 11/20/15 1527 .  feeding supplement (PRO-STAT SUGAR FREE 64) liquid 60 mL, 60 mL, Per Tube, TID, Asencion Islam, RD, 60 mL at 11/20/15 0943 .  feeding supplement (VITAL HIGH PROTEIN) liquid 1,000 mL, 1,000 mL, Per Tube, Q24H, Asencion Islam, RD .  fentaNYL (  SUBLIMAZE) injection 100 mcg, 100 mcg, Intravenous, Q2H PRN, Corey Harold, NP, 100 mcg at 11/20/15 0107 .  heparin injection 5,000 Units, 5,000 Units, Subcutaneous, Q8H, Jose Shirl Harris, MD, 5,000 Units at 11/20/15 1459 .  levETIRAcetam (KEPPRA) 1,000 mg in sodium chloride 0.9 % 100 mL IVPB, 1,000 mg, Intravenous, Q12H, Miquela Costabile Fuller Mandril, MD, 1,000 mg at 11/20/15 0943 .  LORazepam (ATIVAN) injection 2 mg, 2 mg, Intravenous, Q5 min PRN, Corey Harold, NP .  mupirocin ointment (BACTROBAN) 2 % 1 application, 1 application, Nasal, BID, Raylene Miyamoto, MD, 1 application at 22/48/25 [phone] .  pantoprazole (PROTONIX) injection 40 mg, 40 mg, Intravenous, Q12H, Corey Harold, NP, 40 mg at 11/20/15 0943 .  phenylephrine (NEO-SYNEPHRINE) 40 mg in dextrose 5 % 250 mL (0.16 mg/mL) infusion, 30-200 mcg/min, Intravenous, Continuous, Raylene Miyamoto, MD, Stopped at 11/20/15 [phone] .  propofol (DIPRIVAN) 1000 MG/100ML infusion, 5-70 mcg/kg/min, Intravenous, Titrated, Jose Shirl Harris, MD, Stopped at 11/20/15 1103   Neurologic Examination:                                                                                                     Today's Vitals   11/20/15 1600 11/20/15 1700 11/20/15 1800 11/20/15 1900  BP: 106/72 110/78 139/78 119/71  Pulse: 64 83 85 83  Temp: 99.1 F (37.3 C) 99.9 F (37.7 C)  99.9 F (37.7 C)  TempSrc: Core (Comment)     Resp: _0 Height:      Weight:      SpO2: 100% 100% 100% 100%    Evaluation of higher integrative functions including: Level of alertness: Alert,  Oriented to time, place and person Speech: Some hypophonia  she was just extubated prior to my evaluation , no significant dysarthria, no aphasia noted.  Test the following cranial nerves: 2-12 grossly intact Motor examination:  full 5/5 motor strength in all 4 extremities   Lab Results: Basic Metabolic Panel:  Recent Labs Lab 11/18/15 0643 11/18/15 1200 11/19/15 0545 11/19/15 1350 11/20/15 0610  NA 139  --  145  --  146*  K 3.1* 3.2* 3.1* 3.8 3.2*  CL 106  --  110  --  111  CO2 20*  --  23  --  25  GLUCOSE 93  --  99  --  107*  BUN 9  --  <5*  --  8  CREATININE 0.69  --  0.77  --  0.61  CALCIUM 7.3*  --  7.5*  --  8.0*  MG 1.9 1.7 2.1  --  1.8  PHOS 2.8 3.0 1.9* 2.5 2.5    Liver Function Tests:  Recent Labs Lab 11/18/15 0643 11/20/15 0610  AST 182* 74*  ALT 51 35  ALKPHOS 66 71  BILITOT 1.4* 0.8  PROT 5.1* 5.1*  ALBUMIN 3.0* 2.5*    Recent Labs Lab 11/18/15 0643  LIPASE 24  AMYLASE 64    Recent Labs Lab 11/18/15 0704  AMMONIA 27    CBC:  Recent Labs Lab 11/18/15  9357 11/19/15 1050 11/19/15 1700 11/20/15 0610  WBC 5.3 4.9  --  6.7  NEUTROABS 4.4  --   --   --   HGB 11.0* 9.5* 9.9* 9.8*  HCT 32.3* 29.4* 30.9* 31.4*  MCV 98.2 99.3  --  101.3*  PLT 64* 68*  --  94*    Cardiac Enzymes:  Recent Labs Lab 11/18/15 0643 11/18/15 1200 11/18/15 1839  CKTOTAL 268* 252*  --   CKMB  --  27.0*  --   TROPONINI 6.07* 3.26* 1.90*    Lipid Panel:  Recent Labs Lab 11/18/15 0704  TRIG 428*    CBG:  Recent Labs Lab 11/19/15 2327 11/20/15 0404 11/20/15 0734 11/20/15 1128 11/20/15 1529  GLUCAP 105* 95 99 95 92    Microbiology: Results for orders placed or performed during the hospital encounter of 11/18/15  MRSA PCR Screening     Status: Abnormal   Collection Time: 11/18/15  5:25 AM  Result Value Ref Range Status   MRSA by PCR POSITIVE (A) NEGATIVE Final    Comment:        The GeneXpert MRSA Assay (FDA approved for NASAL specimens only), is one component of a comprehensive MRSA  colonization surveillance program. It is not intended to diagnose MRSA infection nor to guide or monitor treatment for MRSA infections. RESULT CALLED TO, READ BACK BY AND VERIFIED WITH: Birdena Jubilee RN 9:35 11/18/15 ([email or web address])   Culture, respiratory (NON-Expectorated)     Status: None (Preliminary result)   Collection Time: 11/18/15 10:50 AM  Result Value Ref Range Status   Specimen Description TRACHEAL ASPIRATE  Final   Special Requests Normal  Final   Gram Stain   Final    ABUNDANT WBC PRESENT,BOTH PMN AND MONONUCLEAR NO SQUAMOUS EPITHELIAL CELLS SEEN RARE GRAM POSITIVE COCCI IN PAIRS Performed at [facility]    Culture   Final    Culture reincubated for better growth Performed at [facility]    Report Status PENDING  Incomplete  Culture, blood (routine x 2)     Status: None (Preliminary result)   Collection Time: 11/18/15 12:00 PM  Result Value Ref Range Status   Specimen Description BLOOD RIGHT ARM  Final   Special Requests BOTTLES DRAWN AEROBIC AND ANAEROBIC 5CC  Final   Culture  Setup Time   Final    GRAM POSITIVE COCCI IN CLUSTERS AEROBIC BOTTLE ONLY Organism ID to follow CRITICAL RESULT CALLED TO, READ BACK BY AND VERIFIED WITH: EHassell Done, PHARM D AT 1130 ON [id number] BY M. WILSON    Culture NO GROWTH 2 DAYS  Final   Report Status PENDING  Incomplete  Blood Culture ID Panel (Reflexed)     Status: Abnormal   Collection Time: 11/18/15 12:00 PM  Result Value Ref Range Status   Enterococcus species NOT DETECTED NOT DETECTED Final   Vancomycin resistance NOT DETECTED NOT DETECTED Final   Listeria monocytogenes NOT DETECTED NOT DETECTED Final   Staphylococcus species DETECTED (A) NOT DETECTED Final    Comment: CRITICAL RESULT CALLED TO, READ BACK BY AND VERIFIED WITH: Alvino Chapel.D. 11:30 11/19/15 ([email or web address])    Staphylococcus aureus NOT DETECTED NOT DETECTED Final   Methicillin resistance DETECTED (A) NOT DETECTED Final    Comment: CRITICAL RESULT  CALLED TO, READ BACK BY AND VERIFIED WITH: EDaisy Lazar.D. 11:30 11/19/15 ([email or web address])    Streptococcus species NOT DETECTED NOT DETECTED Final   Streptococcus agalactiae NOT DETECTED NOT DETECTED Final   Streptococcus pneumoniae NOT DETECTED  NOT DETECTED Final   Streptococcus pyogenes NOT DETECTED NOT DETECTED Final   Acinetobacter baumannii NOT DETECTED NOT DETECTED Final   Enterobacteriaceae species NOT DETECTED NOT DETECTED Final   Enterobacter cloacae complex NOT DETECTED NOT DETECTED Final   Escherichia coli NOT DETECTED NOT DETECTED Final   Klebsiella oxytoca NOT DETECTED NOT DETECTED Final   Klebsiella pneumoniae NOT DETECTED NOT DETECTED Final   Proteus species NOT DETECTED NOT DETECTED Final   Serratia marcescens NOT DETECTED NOT DETECTED Final   Carbapenem resistance NOT DETECTED NOT DETECTED Final   Haemophilus influenzae NOT DETECTED NOT DETECTED Final   Neisseria meningitidis NOT DETECTED NOT DETECTED Final   Pseudomonas aeruginosa NOT DETECTED NOT DETECTED Final   Candida albicans NOT DETECTED NOT DETECTED Final   Candida glabrata NOT DETECTED NOT DETECTED Final   Candida krusei NOT DETECTED NOT DETECTED Final   Candida parapsilosis NOT DETECTED NOT DETECTED Final   Candida tropicalis NOT DETECTED NOT DETECTED Final  Culture, blood (routine x 2)     Status: None (Preliminary result)   Collection Time: 11/18/15 12:09 PM  Result Value Ref Range Status   Specimen Description BLOOD RIGHT HAND  Final   Special Requests BOTTLES DRAWN AEROBIC ONLY [place]  Final   Culture NO GROWTH 2 DAYS  Final   Report Status PENDING  Incomplete  C difficile quick scan w PCR reflex     Status: None   Collection Time: 11/20/15 12:48 AM  Result Value Ref Range Status   C Diff antigen NEGATIVE NEGATIVE Final   C Diff toxin NEGATIVE NEGATIVE Final   C Diff interpretation Negative for toxigenic C. difficile  Final  Culture, blood (routine x 2)     Status: None (Preliminary result)   Collection  Time: 11/20/15 10:40 AM  Result Value Ref Range Status   Specimen Description BLOOD RIGHT ANTECUBITAL  Final   Special Requests IN PEDIATRIC BOTTLE 2CC  Final   Culture PENDING  Incomplete   Report Status PENDING  Incomplete  Culture, blood (routine x 2)     Status: None (Preliminary result)   Collection Time: 11/20/15 10:46 AM  Result Value Ref Range Status   Specimen Description BLOOD LEFT ANTECUBITAL  Final   Special Requests IN PEDIATRIC BOTTLE 3CC  Final   Culture PENDING  Incomplete   Report Status PENDING  Incomplete    Imaging: Ct Head Wo Contrast  11/18/2015  CLINICAL DATA:  Acute encephalopathy. Rule out intracranial hemorrhage before starting heparin. Bilateral parietal occipital hypo attenuation on outside CT per EMR, presumed stroke EXAM: CT HEAD WITHOUT CONTRAST TECHNIQUE: Contiguous axial images were obtained from the base of the skull through the vertex without intravenous contrast. COMPARISON:  None. FINDINGS: Skull and Sinuses:Negative for fracture or destructive process. The visualized mastoids, middle ears, and imaged paranasal sinuses are clear. Advanced right TMJ arthritis. Visualized orbits: Negative. Brain: There is cortical and subcortical low-density fairly symmetrically in the occipital and parietal regions without superimposed hemorrhage. No high-density vessel sign. Elsewhere normal appearance of the brain. No hydrocephalus. IMPRESSION: Symmetric parietal-occipital cortically based edema or infarct with pattern suggesting posterior reversible encephalopathy syndrome. No superimposed hemorrhage. Electronically Signed   By: Monte Fantasia M.D.   On: 11/18/2015 11:55   Mr Jeri Cos OQ Contrast  11/20/2015  CLINICAL DATA:  Loss of consciousness. Mental status changes. Alcohol withdrawal. Abnormal CT scan. EXAM: MRI HEAD WITHOUT AND WITH CONTRAST TECHNIQUE: Multiplanar, multiecho pulse sequences of the brain and surrounding structures were obtained without and with  intravenous contrast. CONTRAST:  51m MULTIHANCE GADOBENATE DIMEGLUMINE 529 MG/ML IV SOLN COMPARISON:  CT head without contrast 11/18/2015 FINDINGS: Extensive posterior parietal and occipital subcortical T2 signal changes are present bilaterally. There is a punctate focus of restricted diffusion in the left parietal lobe on image 42 of series 4. At the focal area of cortical restricted diffusion measures 8 mm on image 23 of series 4 in the left occipital lobe. No other restricted diffusion is present. There is no associated hemorrhage. Minimal scattered subcortical white matter disease is present in the frontal lobes bilaterally as well, worse on the right. The ventricles are of normal size. The internal auditory canals are normal bilaterally. The brainstem and cerebellum are unremarkable. The globes and orbits are intact. The paranasal sinuses are clear. Fluid is present in the mastoid air cells bilaterally. No obstructing nasopharyngeal lesion is evident. The postcontrast images demonstrate no pathologic enhancement. Dedicated imaging of the temporal lobes demonstrate symmetric size and signal of the hippocampal structures. IMPRESSION: 1. Posterior reversible encephalopathy syndrome. 2. Two focal areas are restricted diffusion in the left parietal and occipital lobe may represent non reversible areas of infarction. 3. No associated hemorrhage. 4. Minimal subcortical white matter disease otherwise. Electronically Signed   By: CSan MorelleM.D.   On: 11/20/2015 18:37   Ct Abdomen Pelvis W Contrast  11/18/2015  CLINICAL DATA:  Evaluate the source of blood from NG tube, intubated EXAM: CT ABDOMEN AND PELVIS WITH CONTRAST TECHNIQUE: Multidetector CT imaging of the abdomen and pelvis was performed using the standard protocol following bolus administration of intravenous contrast. CONTRAST:  1031mISOVUE-300 IOPAMIDOL (ISOVUE-300) INJECTION 61% COMPARISON:  None. FINDINGS: Lower chest: Bilateral tiny pleural  effusion. There is bilateral lower lobe posterior small atelectasis or infiltrate. Hepatobiliary: Significant fatty infiltration of the liver. Mild hepatomegaly. There is thickening of gallbladder wall up to 6.5 mm. Probable gallbladder sludge. There is a calcified gallstone in gallbladder neck region measures 9.5 mm. No intrahepatic biliary ductal dilatation. No CBD dilatation. Pancreas: Enhanced pancreas is unremarkable. Spleen: Enhanced spleen is unremarkable. Adrenals/Urinary Tract: No adrenal gland mass. Enhanced kidneys are symmetrical in size. No hydronephrosis or hydroureter. Delayed renal images shows bilateral renal symmetrical excretion. Bilateral visualized proximal ureter is unremarkable. There is mild distended urinary bladder. A Foley catheter is noted within urinary bladder. Small amount of air within urinary bladder anteriorly is probable post instrumentation. Stomach/Bowel: There is no gastric outlet obstruction. There is a NG tube in place with tip in proximal stomach against the lateral gastric wall. There is no evidence of gastric wall hematoma adjacent to tip of the NG tube at. No evidence of intraluminal hematoma. No pericecal inflammation. Terminal ileum is unremarkable. Normal appendix partially visualized in axial image 72. There is no distal colonic obstruction. The right colon descending colon and sigmoid colon is empty collapsed. Some colonic gas noted in transverse colon. No small bowel obstruction. No thickened or dilated small bowel loops. Vascular/Lymphatic: There is no retroperitoneal or mesenteric adenopathy. No aortic aneurysm. Reproductive: The uterus and ovaries are unremarkable. The uterus is small size. No adnexal mass. Other: No ascites or free air. No retroperitoneal or mesenteric hematoma. No inguinal adenopathy. Musculoskeletal: No destructive bony lesions are noted. Degenerative changes are noted lumbar spine. Probable Schmorl's node deformity noted upper endplate of L4  vertebral body. Significant disc space flattening with vacuum disc phenomenon mild anterior spurring and endplate sclerotic changes at L1-L2 and L2-L3 level. Mild upper lumbar levoscoliosis. IMPRESSION: 1. There is significant fatty infiltration  of the liver. Mild hepatomegaly. 2. There is thickening of gallbladder wall up to 6.5 mm. Clinical correlation is necessary to exclude cholecystitis. Further correlation with gallbladder ultrasound could be performed as clinically warranted. Probable sludge within gallbladder lumen. There is a calcified gallstone in neck of the gallbladder measures 9.5 mm. 3. There is NG tube with tip in proximal stomach with tip against the lateral gastric wall. There is no evidence of intraluminal or mural hematoma. 4. Tiny bilateral pleural effusion with bilateral basilar posterior atelectasis or infiltrate. 5. Normal appendix.  No pericecal inflammation. 6. No small bowel obstruction. 7. Mild distended urinary bladder. Small amount of air within urinary bladder probable post instrumentation. The bladder catheter is noted. 8. Unremarkable uterus and ovaries. 9. Degenerative changes lumbar spine. Electronically Signed   By: Lahoma Crocker M.D.   On: 11/18/2015 12:59   Dg Chest Port 1 View  11/20/2015  CLINICAL DATA:  Respiratory failure. EXAM: PORTABLE CHEST 1 VIEW COMPARISON:  11/18/2015. FINDINGS: Endotracheal tube, left IJ line, NG tube in stable position. Left lower lobe infiltrate and/or atelectasis. Small left pleural effusion. No pneumothorax . IMPRESSION: 1. Lines and tubes in stable position. 2. Left lower lobe infiltrate and/or atelectasis. Small left pleural effusion. Electronically Signed   By: Marcello Moores  Register   On: 11/20/2015 07:37   Dg Chest Port 1 View  11/18/2015  CLINICAL DATA:  Status post central line placement EXAM: PORTABLE CHEST 1 VIEW COMPARISON:  11/18/2015 FINDINGS: Endotracheal tube and nasogastric catheter are again seen and stable. A new left jugular central  line is noted with the catheter tip in the mid superior vena cava. No pneumothorax is noted. Some crowding of the vascular markings is noted due to a poor inspiratory effort. IMPRESSION: No pneumothorax following central line placement. Electronically Signed   By: Inez Catalina M.D.   On: 11/18/2015 11:19   Dg Chest Port 1 View  11/18/2015  CLINICAL DATA:  Endotracheal tube.  History of hypertension. EXAM: PORTABLE CHEST 1 VIEW COMPARISON:  None. FINDINGS: Endotracheal tube placed with tip measuring 3.5 cm above the carina. Enteric tube is present with tip off of the field of view but below the left hemidiaphragm. Heart size and pulmonary vascularity are normal. Tortuous aorta. Prominent right paratracheal shadow is likely vascular. No focal airspace disease or consolidation in the lungs. No blunting of costophrenic angles. No pneumothorax. IMPRESSION: Appliances appear in satisfactory position. No evidence of active pulmonary disease. Electronically Signed   By: Lucienne Capers M.D.   On: 11/18/2015 06:48    Assessment and plan:   Abigail Hull is an 55 y.o. female patient with with suspected press based on the CT of the brain, alcohol abuse history, was entered with altered mental status and respiratory failure and was intubated in ICU. She was extubated this morning his been doing very well, following commands and grossly nonfocal neurological exam. Recommend MRI of the brain for completion of her neurological workup. We'll follow-up after MRI.

## 2015-11-20 NOTE — Progress Notes (Signed)
eLink Physician-Brief Progress Note Patient Name: Abigail HutchingSusan Hull DOB: 1961-05-22 MRN: [id number]   Date of Service  11/20/2015  HPI/Events of Note  Multiple loose stools in the setting of fever/ABX.  Stools loose prior to starting TFs.  eICU Interventions  Plan: Check for C Diff     Intervention Category Minor Interventions: Agitation / anxiety - evaluation and management;Clinical assessment - ordering diagnostic tests  DETERDING,ELIZABETH 11/20/2015, 12:48 AM

## 2015-11-20 NOTE — Progress Notes (Signed)
ANTICOAGULATION CONSULT NOTE - Follow-up Consult  Pharmacy Consult for heparin Indication: NSTEMI  Not on File  Patient Measurements: Height: 5\' 3"  (160 cm) Weight: 186 lb 4.6 oz (84.5 kg) IBW/kg (Calculated) : 52.4 Heparin Dosing Weight: 70.1 kg  Vital Signs: Temp: 100.6 F (38.1 C) (05/19 0615) Temp Source: Core (Comment) (05/18 2000) BP: 88/56 mmHg (05/19 0715) Pulse Rate: 51 (05/19 0715)  Labs:  Recent Labs  11/18/15 0643 11/18/15 1200 11/18/15 1839  11/19/15 0545 11/19/15 1050 11/19/15 1700 11/20/15 0610  HGB 11.0*  --   --   --   --  9.5* 9.9* 9.8*  HCT 32.3*  --   --   --   --  29.4* 30.9* 31.4*  PLT 64*  --   --   --   --  68*  --  94*  APTT  --  31  --   --   --   --   --   --   LABPROT 14.3  --   --   --   --   --   --   --   INR 1.09  --   --   --   --   --   --   --   HEPARINUNFRC  --   --   --   < > 0.40 0.33  --  0.51  CREATININE 0.69  --   --   --  0.77  --   --   --   CKTOTAL 268* 252*  --   --   --   --   --   --   CKMB  --  27.0*  --   --   --   --   --   --   TROPONINI 6.07* 3.26* 1.90*  --   --   --   --   --   < > = values in this interval not displayed.  Estimated Creatinine Clearance: 83.7 mL/min (by C-G formula based on Cr of 0.77).   Assessment: 55 yo F on heparin for NSTEMI. Troponin peaked at 6.07, now down to 1.9. CT head neg for bleed. No anticoag PTA. FOBT+. Last HL therapeutic remains therapeutic at 0.51. Hgb down to 9.8, plts up to 94. Watch s/s of bleed closely.  Goal of Therapy:  Heparin level 0.3-0.7 units/ml Monitor platelets by anticoagulation protocol: Yes   Plan:  Continue heparin gtt at 1,100 units/hr Monitor daily HL, CBC, s/s of bleed  Enzo BiNathan Ronette Hank, PharmD, [facility] Bethes[facility] HospitalBCPS Clinical Pharmacist Pager [phone] 11/20/2015 7:51 AM

## 2015-11-20 NOTE — Progress Notes (Signed)
Pharmacy Antibiotic Note  Abigail HutchingSusan Hull is a 55 y.o. female admitted on 11/18/2015 with PNA.  Pharmacy has been consulted for vancomycin dosing.  Day #3 of abx for aspiration PNA. Tmax of 100.6 yesterday, WBC wnl. Now wanting to switch to vancomycin for possible staph infection. SCr stable, CrCl ~580ml/min  Plan: Continue Unasyn per MD Give vancomycin 1.75g IV x 1 Then start vancomycin 1g IV Q12 Monitor clinical picture, renal function, VT prn F/U C&S, abx deescalation / LOT   Height: 5\' 3"  (160 cm) Weight: 186 lb 4.6 oz (84.5 kg) IBW/kg (Calculated) : 52.4  Temp (24hrs), Avg:100 F (37.8 C), Min:97.9 F (36.6 C), Max:101.3 F (38.5 C)   Recent Labs Lab 11/18/15 0643 11/19/15 0545 11/19/15 1050 11/20/15 0610  WBC 5.3  --  4.9 6.7  CREATININE 0.69 0.77  --  0.61  LATICACIDVEN 0.8  --   --   --     Estimated Creatinine Clearance: 83.7 mL/min (by C-G formula based on Cr of 0.61).    Not on File   Thank you for allowing pharmacy to be a part of this patient's care.  Enzo BiNathan Brayleigh Rybacki, PharmD, BCPS Clinical Pharmacist Pager [phone] 11/20/2015 10:10 AM

## 2015-11-20 NOTE — Procedures (Signed)
Extubation Procedure Note  Patient Details:   Name: Abigail Hull DOB: 11/25/1960 MRN: [id number]   Airway Documentation:     Evaluation  O2 sats: stable throughout Complications: No apparent complications Patient did tolerate procedure well. Bilateral Breath Sounds: Rhonchi   Yes   Patient was extubated to a 4L [place]. Cuff leak was heard. No stridor was noted. RN was at bedside with RT during extubation. Patient was able to vocalize after extubation. RT will continue to monitor.  Danella Maiersshley M [facility] Hospitalol[facility] 11/20/2015, 11:24 AM

## 2015-11-20 NOTE — Progress Notes (Signed)
Interval History:                                                                                                                      Abigail Hull is an 56 y.o. female patient with  PRES, etoh abuse. He remains intubated. No seizure-like activity clinically.  Past Medical History: Past Medical History  Diagnosis Date  . Hypertension   . Depression   . Alcohol abuse   . Cholelithiasis     No past surgical history on file.  Family History: No family history on file.  Social History:   has no tobacco, alcohol, and drug history on file.  Allergies:  Not on File   Medications:                                                                                                                         Current facility-administered medications:  .  0.9 %  sodium chloride infusion, 250 mL, Intravenous, PRN, Corey Harold, NP .  0.9 %  sodium chloride infusion, , Intravenous, Continuous, Jose Shirl Harris, MD, Last Rate: 50 mL/hr at 11/19/15 1900 .  Ampicillin-Sulbactam (UNASYN) 3 g in sodium chloride 0.9 % 100 mL IVPB, 3 g, Intravenous, Q8H, Romona Curls, RPH, 3 g at 11/20/15 0356 .  antiseptic oral rinse solution (CORINZ), 7 mL, Mouth Rinse, 10 times per day, Raylene Miyamoto, MD, 7 mL at 11/20/15 0600 .  chlorhexidine gluconate (SAGE KIT) (PERIDEX) 0.12 % solution 15 mL, 15 mL, Mouth Rinse, BID, Jose Shirl Harris, MD, 15 mL at 11/20/15 [phone] .  Chlorhexidine Gluconate Cloth 2 % PADS 6 each, 6 each, Topical, Q0600, Raylene Miyamoto, MD, 6 each at 11/20/15 0600 .  feeding supplement (PRO-STAT SUGAR FREE 64) liquid 60 mL, 60 mL, Per Tube, TID, Asencion Islam, RD, 60 mL at 11/19/15 2159 .  feeding supplement (VITAL HIGH PROTEIN) liquid 1,000 mL, 1,000 mL, Per Tube, Q24H, Asencion Islam, RD, 1,000 mL at 11/19/15 1951 .  fentaNYL (SUBLIMAZE) injection 100 mcg, 100 mcg, Intravenous, Q2H PRN, Corey Harold, NP, 100 mcg at 11/20/15 0107 .  heparin ADULT infusion 100 units/mL (25000  units/250 mL), 1,100 Units/hr, Intravenous, Continuous, Reginia Naas, RPH, Last Rate: 11 mL/hr at 11/19/15 1900, 1,100 Units/hr at 11/19/15 1900 .  levETIRAcetam (KEPPRA) 1,000 mg in sodium chloride 0.9 % 100 mL IVPB, 1,000 mg, Intravenous, Q12H, Madell Heino Fuller Mandril, MD, 1,000 mg at 11/19/15  2200 .  LORazepam (ATIVAN) injection 2 mg, 2 mg, Intravenous, Q5 min PRN, Corey Harold, NP .  mupirocin ointment (BACTROBAN) 2 % 1 application, 1 application, Nasal, BID, Raylene Miyamoto, MD, 1 application at 62/83/15 2200 .  pantoprazole (PROTONIX) injection 40 mg, 40 mg, Intravenous, Q12H, Corey Harold, NP, 40 mg at 11/19/15 2200 .  phenylephrine (NEO-SYNEPHRINE) 40 mg in dextrose 5 % 250 mL (0.16 mg/mL) infusion, 30-200 mcg/min, Intravenous, Continuous, Raylene Miyamoto, MD, Last Rate: 7.5 mL/hr at 11/20/15 0816, 20 mcg/min at 11/20/15 0816 .  propofol (DIPRIVAN) 1000 MG/100ML infusion, 5-70 mcg/kg/min, Intravenous, Titrated, [address], MD, Last Rate: 14.6 mL/hr at 11/20/15 0802, 30 mcg/kg/min at 11/20/15 0802   Neurologic Examination:                                                                                                     Today's Vitals   11/20/15 0715 11/20/15 0730 11/20/15 0745 11/20/15 0800  BP: 88/56 91/67 111/74 118/75  Pulse: 51 53 81 63  Temp:      TempSrc:      Resp: _0 Height:      Weight:      SpO2: 99% 98% 98% 98%     intubated, sedated with propofol 60 mcg/kg/minDuring my evaluation.  No gaze deviation or nystagmus, pupils equal, reactive, No motor response.   Was apparently following commands at lower doses of propofol per nursing staff   Lab Results: Basic Metabolic Panel:  Recent Labs Lab 11/18/15 0643 11/18/15 1200 11/19/15 0545 11/19/15 1350 11/20/15 0610  NA 139  --  145  --  146*  K 3.1* 3.2* 3.1* 3.8 3.2*  CL 106  --  110  --  111  CO2 20*  --  23  --  25  GLUCOSE 93  --  99  --  107*  BUN 9  --  <5*  --   8  CREATININE 0.69  --  0.77  --  0.61  CALCIUM 7.3*  --  7.5*  --  8.0*  MG 1.9 1.7 2.1  --  1.8  PHOS 2.8 3.0 1.9* 2.5 2.5    Liver Function Tests:  Recent Labs Lab 11/18/15 0643 11/20/15 0610  AST 182* 74*  ALT 51 35  ALKPHOS 66 71  BILITOT 1.4* 0.8  PROT 5.1* 5.1*  ALBUMIN 3.0* 2.5*    Recent Labs Lab 11/18/15 0643  LIPASE 24  AMYLASE 64    Recent Labs Lab 11/18/15 0704  AMMONIA 27    CBC:  Recent Labs Lab 11/18/15 0643 11/19/15 1050 11/19/15 1700 11/20/15 0610  WBC 5.3 4.9  --  6.7  NEUTROABS 4.4  --   --   --   HGB 11.0* 9.5* 9.9* 9.8*  HCT 32.3* 29.4* 30.9* 31.4*  MCV 98.2 99.3  --  101.3*  PLT 64* 68*  --  94*    Cardiac Enzymes:  Recent Labs Lab 11/18/15 0643 11/18/15 1200 11/18/15 1839  CKTOTAL 268* 252*  --   CKMB  --  27.0*  --   TROPONINI 6.07* 3.26* 1.90*    Lipid Panel:  Recent Labs Lab 11/18/15 0704  TRIG 428*    CBG:  Recent Labs Lab 11/19/15 1551 11/19/15 1955 11/19/15 2327 11/20/15 0404 11/20/15 0734  GLUCAP 109* 102* 105* 95 99    Microbiology: Results for orders placed or performed during the hospital encounter of 11/18/15  MRSA PCR Screening     Status: Abnormal   Collection Time: 11/18/15  5:25 AM  Result Value Ref Range Status   MRSA by PCR POSITIVE (A) NEGATIVE Final    Comment:        The GeneXpert MRSA Assay (FDA approved for NASAL specimens only), is one component of a comprehensive MRSA colonization surveillance program. It is not intended to diagnose MRSA infection nor to guide or monitor treatment for MRSA infections. RESULT CALLED TO, READ BACK BY AND VERIFIED WITH: Birdena Jubilee RN 9:35 11/18/15 ([email or web address])   Culture, respiratory (NON-Expectorated)     Status: None (Preliminary result)   Collection Time: 11/18/15 10:50 AM  Result Value Ref Range Status   Specimen Description TRACHEAL ASPIRATE  Final   Special Requests Normal  Final   Gram Stain   Final    ABUNDANT WBC PRESENT,BOTH PMN  AND MONONUCLEAR NO SQUAMOUS EPITHELIAL CELLS SEEN RARE GRAM POSITIVE COCCI IN PAIRS Performed at [facility]    Culture   Final    Culture reincubated for better growth Performed at [facility]    Report Status PENDING  Incomplete  Culture, blood (routine x 2)     Status: None (Preliminary result)   Collection Time: 11/18/15 12:00 PM  Result Value Ref Range Status   Specimen Description BLOOD RIGHT ARM  Final   Special Requests BOTTLES DRAWN AEROBIC AND ANAEROBIC 5CC  Final   Culture  Setup Time   Final    GRAM POSITIVE COCCI IN CLUSTERS AEROBIC BOTTLE ONLY Organism ID to follow CRITICAL RESULT CALLED TO, READ BACK BY AND VERIFIED WITH: EHassell Done, PHARM D AT 74 ON [id number] BY M. WILSON    Culture NO GROWTH 1 DAY  Final   Report Status PENDING  Incomplete  Blood Culture ID Panel (Reflexed)     Status: Abnormal   Collection Time: 11/18/15 12:00 PM  Result Value Ref Range Status   Enterococcus species NOT DETECTED NOT DETECTED Final   Vancomycin resistance NOT DETECTED NOT DETECTED Final   Listeria monocytogenes NOT DETECTED NOT DETECTED Final   Staphylococcus species DETECTED (A) NOT DETECTED Final    Comment: CRITICAL RESULT CALLED TO, READ BACK BY AND VERIFIED WITH: Alvino Chapel.D. 11:30 11/19/15 ([email or web address])    Staphylococcus aureus NOT DETECTED NOT DETECTED Final   Methicillin resistance DETECTED (A) NOT DETECTED Final    Comment: CRITICAL RESULT CALLED TO, READ BACK BY AND VERIFIED WITH: EDaisy Lazar.D. 11:30 11/19/15 ([email or web address])    Streptococcus species NOT DETECTED NOT DETECTED Final   Streptococcus agalactiae NOT DETECTED NOT DETECTED Final   Streptococcus pneumoniae NOT DETECTED NOT DETECTED Final   Streptococcus pyogenes NOT DETECTED NOT DETECTED Final   Acinetobacter baumannii NOT DETECTED NOT DETECTED Final   Enterobacteriaceae species NOT DETECTED NOT DETECTED Final   Enterobacter cloacae complex NOT DETECTED NOT DETECTED Final   Escherichia  coli NOT DETECTED NOT DETECTED Final   Klebsiella oxytoca NOT DETECTED NOT DETECTED Final   Klebsiella pneumoniae NOT DETECTED NOT DETECTED Final   Proteus species NOT DETECTED NOT DETECTED Final   Serratia marcescens NOT DETECTED  NOT DETECTED Final   Carbapenem resistance NOT DETECTED NOT DETECTED Final   Haemophilus influenzae NOT DETECTED NOT DETECTED Final   Neisseria meningitidis NOT DETECTED NOT DETECTED Final   Pseudomonas aeruginosa NOT DETECTED NOT DETECTED Final   Candida albicans NOT DETECTED NOT DETECTED Final   Candida glabrata NOT DETECTED NOT DETECTED Final   Candida krusei NOT DETECTED NOT DETECTED Final   Candida parapsilosis NOT DETECTED NOT DETECTED Final   Candida tropicalis NOT DETECTED NOT DETECTED Final  Culture, blood (routine x 2)     Status: None (Preliminary result)   Collection Time: 11/18/15 12:09 PM  Result Value Ref Range Status   Specimen Description BLOOD RIGHT HAND  Final   Special Requests BOTTLES DRAWN AEROBIC ONLY [place]  Final   Culture NO GROWTH 1 DAY  Final   Report Status PENDING  Incomplete  C difficile quick scan w PCR reflex     Status: None   Collection Time: 11/20/15 12:48 AM  Result Value Ref Range Status   C Diff antigen NEGATIVE NEGATIVE Final   C Diff toxin NEGATIVE NEGATIVE Final   C Diff interpretation Negative for toxigenic C. difficile  Final    Imaging: Ct Head Wo Contrast  11/18/2015  CLINICAL DATA:  Acute encephalopathy. Rule out intracranial hemorrhage before starting heparin. Bilateral parietal occipital hypo attenuation on outside CT per EMR, presumed stroke EXAM: CT HEAD WITHOUT CONTRAST TECHNIQUE: Contiguous axial images were obtained from the base of the skull through the vertex without intravenous contrast. COMPARISON:  None. FINDINGS: Skull and Sinuses:Negative for fracture or destructive process. The visualized mastoids, middle ears, and imaged paranasal sinuses are clear. Advanced right TMJ arthritis. Visualized orbits:  Negative. Brain: There is cortical and subcortical low-density fairly symmetrically in the occipital and parietal regions without superimposed hemorrhage. No high-density vessel sign. Elsewhere normal appearance of the brain. No hydrocephalus. IMPRESSION: Symmetric parietal-occipital cortically based edema or infarct with pattern suggesting posterior reversible encephalopathy syndrome. No superimposed hemorrhage. Electronically Signed   By: Monte Fantasia M.D.   On: 11/18/2015 11:55   Ct Abdomen Pelvis W Contrast  11/18/2015  CLINICAL DATA:  Evaluate the source of blood from NG tube, intubated EXAM: CT ABDOMEN AND PELVIS WITH CONTRAST TECHNIQUE: Multidetector CT imaging of the abdomen and pelvis was performed using the standard protocol following bolus administration of intravenous contrast. CONTRAST:  136m ISOVUE-300 IOPAMIDOL (ISOVUE-300) INJECTION 61% COMPARISON:  None. FINDINGS: Lower chest: Bilateral tiny pleural effusion. There is bilateral lower lobe posterior small atelectasis or infiltrate. Hepatobiliary: Significant fatty infiltration of the liver. Mild hepatomegaly. There is thickening of gallbladder wall up to 6.5 mm. Probable gallbladder sludge. There is a calcified gallstone in gallbladder neck region measures 9.5 mm. No intrahepatic biliary ductal dilatation. No CBD dilatation. Pancreas: Enhanced pancreas is unremarkable. Spleen: Enhanced spleen is unremarkable. Adrenals/Urinary Tract: No adrenal gland mass. Enhanced kidneys are symmetrical in size. No hydronephrosis or hydroureter. Delayed renal images shows bilateral renal symmetrical excretion. Bilateral visualized proximal ureter is unremarkable. There is mild distended urinary bladder. A Foley catheter is noted within urinary bladder. Small amount of air within urinary bladder anteriorly is probable post instrumentation. Stomach/Bowel: There is no gastric outlet obstruction. There is a NG tube in place with tip in proximal stomach against  the lateral gastric wall. There is no evidence of gastric wall hematoma adjacent to tip of the NG tube at. No evidence of intraluminal hematoma. No pericecal inflammation. Terminal ileum is unremarkable. Normal appendix partially visualized in axial image 72.  There is no distal colonic obstruction. The right colon descending colon and sigmoid colon is empty collapsed. Some colonic gas noted in transverse colon. No small bowel obstruction. No thickened or dilated small bowel loops. Vascular/Lymphatic: There is no retroperitoneal or mesenteric adenopathy. No aortic aneurysm. Reproductive: The uterus and ovaries are unremarkable. The uterus is small size. No adnexal mass. Other: No ascites or free air. No retroperitoneal or mesenteric hematoma. No inguinal adenopathy. Musculoskeletal: No destructive bony lesions are noted. Degenerative changes are noted lumbar spine. Probable Schmorl's node deformity noted upper endplate of L4 vertebral body. Significant disc space flattening with vacuum disc phenomenon mild anterior spurring and endplate sclerotic changes at L1-L2 and L2-L3 level. Mild upper lumbar levoscoliosis. IMPRESSION: 1. There is significant fatty infiltration of the liver. Mild hepatomegaly. 2. There is thickening of gallbladder wall up to 6.5 mm. Clinical correlation is necessary to exclude cholecystitis. Further correlation with gallbladder ultrasound could be performed as clinically warranted. Probable sludge within gallbladder lumen. There is a calcified gallstone in neck of the gallbladder measures 9.5 mm. 3. There is NG tube with tip in proximal stomach with tip against the lateral gastric wall. There is no evidence of intraluminal or mural hematoma. 4. Tiny bilateral pleural effusion with bilateral basilar posterior atelectasis or infiltrate. 5. Normal appendix.  No pericecal inflammation. 6. No small bowel obstruction. 7. Mild distended urinary bladder. Small amount of air within urinary bladder  probable post instrumentation. The bladder catheter is noted. 8. Unremarkable uterus and ovaries. 9. Degenerative changes lumbar spine. Electronically Signed   By: Lahoma Crocker M.D.   On: 11/18/2015 12:59   Dg Chest Port 1 View  11/20/2015  CLINICAL DATA:  Respiratory failure. EXAM: PORTABLE CHEST 1 VIEW COMPARISON:  11/18/2015. FINDINGS: Endotracheal tube, left IJ line, NG tube in stable position. Left lower lobe infiltrate and/or atelectasis. Small left pleural effusion. No pneumothorax . IMPRESSION: 1. Lines and tubes in stable position. 2. Left lower lobe infiltrate and/or atelectasis. Small left pleural effusion. Electronically Signed   By: Marcello Moores  Register   On: 11/20/2015 07:37   Dg Chest Port 1 View  11/18/2015  CLINICAL DATA:  Status post central line placement EXAM: PORTABLE CHEST 1 VIEW COMPARISON:  11/18/2015 FINDINGS: Endotracheal tube and nasogastric catheter are again seen and stable. A new left jugular central line is noted with the catheter tip in the mid superior vena cava. No pneumothorax is noted. Some crowding of the vascular markings is noted due to a poor inspiratory effort. IMPRESSION: No pneumothorax following central line placement. Electronically Signed   By: Inez Catalina M.D.   On: 11/18/2015 11:19   Dg Chest Port 1 View  11/18/2015  CLINICAL DATA:  Endotracheal tube.  History of hypertension. EXAM: PORTABLE CHEST 1 VIEW COMPARISON:  None. FINDINGS: Endotracheal tube placed with tip measuring 3.5 cm above the carina. Enteric tube is present with tip off of the field of view but below the left hemidiaphragm. Heart size and pulmonary vascularity are normal. Tortuous aorta. Prominent right paratracheal shadow is likely vascular. No focal airspace disease or consolidation in the lungs. No blunting of costophrenic angles. No pneumothorax. IMPRESSION: Appliances appear in satisfactory position. No evidence of active pulmonary disease. Electronically Signed   By: Lucienne Capers M.D.    On: 11/18/2015 06:48    Assessment and plan:   Abigail Hull is an 55 y.o. female patient with Posterior reversible encephalopathy syndrome, and alcohol abuse with liver failure,  presented with acute encephalopathy, intubated sedated propofol,  on heparin drip for acute coronary syndrome.  MRI of the brain is pending as she is on 3 infusions and MRI compatible pump can only hold 2 infusions. Prior EEG was abnormal, on Keppra 1 g twice a day.  Follow-up after completing the brain MRI.

## 2015-11-21 DIAGNOSIS — I639 Cerebral infarction, unspecified: Secondary | ICD-10-CM | POA: Diagnosis present

## 2015-11-21 LAB — CULTURE, BLOOD (ROUTINE X 2)

## 2015-11-21 LAB — BASIC METABOLIC PANEL
Anion gap: 16 — ABNORMAL HIGH (ref 5–15)
CHLORIDE: 104 mmol/L (ref 101–111)
CO2: 26 mmol/L (ref 22–32)
CREATININE: 0.7 mg/dL (ref 0.44–1.00)
Calcium: 8.4 mg/dL — ABNORMAL LOW (ref 8.9–10.3)
GFR calc Af Amer: >60 mL/min (ref >60)
GLUCOSE: 88 mg/dL (ref 65–99)
POTASSIUM: 2.7 mmol/L — AB (ref 3.5–5.1)
Sodium: 146 mmol/L — ABNORMAL HIGH (ref 135–145)

## 2015-11-21 LAB — CBC
HEMATOCRIT: 31.7 % — AB (ref 36.0–46.0)
Hemoglobin: 9.9 g/dL — ABNORMAL LOW (ref 12.0–15.0)
MCH: 31.3 pg (ref 26.0–34.0)
MCHC: 31.2 g/dL (ref 30.0–36.0)
MCV: 100.3 fL — AB (ref 78.0–100.0)
PLATELETS: 122 10*3/uL — AB (ref 150–400)
RBC: 3.16 MIL/uL — ABNORMAL LOW (ref 3.87–5.11)
RDW: 15.6 % — AB (ref 11.5–15.5)
WBC: 5.3 10*3/uL (ref 4.0–10.5)

## 2015-11-21 LAB — GLUCOSE, CAPILLARY
GLUCOSE-CAPILLARY: 128 mg/dL — AB (ref 65–99)
Glucose-Capillary: 63 mg/dL — ABNORMAL LOW (ref 65–99)
Glucose-Capillary: 112 mg/dL — ABNORMAL HIGH (ref 65–99)
Glucose-Capillary: 88 mg/dL (ref 65–99)
Glucose-Capillary: 131 mg/dL — ABNORMAL HIGH (ref 65–99)

## 2015-11-21 LAB — MAGNESIUM: Magnesium: 1.7 mg/dL (ref 1.7–2.4)

## 2015-11-21 LAB — PHOSPHORUS: Phosphorus: 2.4 mg/dL — ABNORMAL LOW (ref 2.5–4.6)

## 2015-11-21 LAB — TRIGLYCERIDES: Triglycerides: 298 mg/dL — ABNORMAL HIGH (ref <150)

## 2015-11-21 MED ORDER — VITAMIN B-1 100 MG PO TABS
100.0000 mg | ORAL_TABLET | Freq: Every day | ORAL | Status: DC
  Administered 2015-11-21 – 2015-11-24 (×4): 100 mg via ORAL
  Filled 2015-11-21 (×4): qty 1

## 2015-11-21 MED ORDER — FOLIC ACID 1 MG PO TABS
1.0000 mg | ORAL_TABLET | Freq: Every day | ORAL | Status: DC
  Administered 2015-11-21 – 2015-11-24 (×4): 1 mg via ORAL
  Filled 2015-11-21 (×4): qty 1

## 2015-11-21 MED ORDER — DEXTROSE 50 % IV SOLN
25.0000 mL | Freq: Once | INTRAVENOUS | Status: AC
  Administered 2015-11-21: 25 mL via INTRAVENOUS

## 2015-11-21 MED ORDER — DEXTROSE 50 % IV SOLN
INTRAVENOUS | Status: AC
  Administered 2015-11-21: 25 mL via INTRAVENOUS
  Filled 2015-11-21: qty 50

## 2015-11-21 MED ORDER — VANCOMYCIN HCL IN DEXTROSE 1-5 GM/200ML-% IV SOLN
1000.0000 mg | Freq: Two times a day (BID) | INTRAVENOUS | Status: DC
  Administered 2015-11-21 – 2015-11-24 (×7): 1000 mg via INTRAVENOUS
  Filled 2015-11-21 (×9): qty 200

## 2015-11-21 MED ORDER — LORAZEPAM 2 MG/ML IJ SOLN
1.0000 mg | Freq: Four times a day (QID) | INTRAMUSCULAR | Status: DC | PRN
  Administered 2015-11-21 – 2015-11-23 (×2): 1 mg via INTRAVENOUS
  Filled 2015-11-21 (×2): qty 1

## 2015-11-21 MED ORDER — LORAZEPAM 1 MG PO TABS
1.0000 mg | ORAL_TABLET | Freq: Four times a day (QID) | ORAL | Status: DC | PRN
  Administered 2015-11-22: 1 mg via ORAL
  Filled 2015-11-21: qty 1

## 2015-11-21 MED ORDER — CETYLPYRIDINIUM CHLORIDE 0.05 % MT LIQD
7.0000 mL | Freq: Two times a day (BID) | OROMUCOSAL | Status: DC
  Administered 2015-11-21 – 2015-11-24 (×4): 7 mL via OROMUCOSAL

## 2015-11-21 MED ORDER — ADULT MULTIVITAMIN W/MINERALS CH
1.0000 | ORAL_TABLET | Freq: Every day | ORAL | Status: DC
  Administered 2015-11-21 – 2015-11-24 (×4): 1 via ORAL
  Filled 2015-11-21 (×4): qty 1

## 2015-11-21 MED ORDER — ACETAMINOPHEN 325 MG PO TABS
325.0000 mg | ORAL_TABLET | Freq: Once | ORAL | Status: AC
  Administered 2015-11-21: 325 mg via ORAL
  Filled 2015-11-21: qty 1

## 2015-11-21 MED ORDER — MAGNESIUM SULFATE 2 GM/50ML IV SOLN
2.0000 g | Freq: Once | INTRAVENOUS | Status: AC
  Administered 2015-11-21: 2 g via INTRAVENOUS
  Filled 2015-11-21: qty 50

## 2015-11-21 MED ORDER — POTASSIUM PHOSPHATES 15 MMOLE/5ML IV SOLN
20.0000 mmol | Freq: Once | INTRAVENOUS | Status: AC
  Administered 2015-11-21: 20 mmol via INTRAVENOUS
  Filled 2015-11-21: qty 6.67

## 2015-11-21 MED ORDER — THIAMINE HCL 100 MG/ML IJ SOLN: 100.0000 mg | Freq: Every day | INTRAMUSCULAR | Status: DC

## 2015-11-21 NOTE — Evaluation (Signed)
Clinical/Bedside Swallow Evaluation Patient Details  Name: Abigail HutchingSusan Peake MRN: [id number] Date of Birth: 1960-09-07  Today's Date: 11/21/2015 Time: SLP Start Time (ACUTE ONLY): 0840 SLP Stop Time (ACUTE ONLY): 0855 SLP Time Calculation (min) (ACUTE ONLY): 15 min  Past Medical History:  Past Medical History  Diagnosis Date  . Hypertension   . Depression   . Alcohol abuse   . Cholelithiasis   . Hyperlipemia    Past Surgical History: History reviewed. No pertinent past surgical history. HPI:  55 year old female past medical history significant for alcohol abuse and hypertension. 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 She typically drinks about 1 pint of hard alcohol daily, and has not had a drink for about 3 days now. She was last seen normal 5/16 at around noon. Since that time she has had significant nausea vomiting, blurry vision, difficulty ambulating, and apparent hallucinations. Her mental status continued to worsen since that time, until her husband witnessed her having a seizure-like episode and called EMS. [facility] she was unresponsive and emergently intubated for airway protection. CT of the head showed bilateral areas of bilateral parietal occipital foci of poorly defined hypoattenuation. Ammonia level was also elevated. [facility].   Assessment / Plan / Recommendation Clinical Impression  Pt extubated 11/20/15 after a 2 day intubation. Pt reports a slightly sore throat. Pt oriented x 3, but slow responses (suspect medication related). Pt presents with no s/s of aspiration with all consistencies presented.  Recommend pt begin regular diet with thin liquids and meds given with liquid. Speech therapy will monitor for diet tolerance.     Aspiration Risk  Mild aspiration risk    Diet Recommendation Regular;Thin  liquid   Liquid Administration via: Cup;Straw Medication Administration: Whole meds with liquid Supervision: Staff to assist with self feeding Compensations: Slow rate;Small sips/bites;Minimize environmental distractions Postural Changes: Seated upright at 90 degrees;Remain upright for at least 30 minutes after po intake    Other  Recommendations Oral Care Recommendations: Oral care BID   Follow up Recommendations       Frequency and Duration min 1 x/week  1 week       Prognosis Prognosis for Safe Diet Advancement: Good      Swallow Study   General Date of Onset: 11/18/15 HPI: 55 year old female past medical history significant for alcohol abuse and hypertension. 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 She typically drinks about 1 pint of hard alcohol daily, and has not had a drink for about 3 days now. She was last seen normal 5/16 at around noon. Since that time she has had significant nausea vomiting, blurry vision, difficulty ambulating, and apparent hallucinations. Her mental status continued to worsen since that time, until her husband witnessed her having a seizure-like episode and called EMS. [facility] she was unresponsive and emergently intubated for airway protection. CT of the head showed bilateral areas of bilateral parietal occipital foci of poorly defined hypoattenuation. Ammonia level was also elevated. [facility]. Type of Study: Bedside Swallow Evaluation Diet Prior to this Study: NPO Temperature Spikes Noted: No Respiratory Status: Nasal cannula History of Recent Intubation: Yes Length of Intubations (days):  2 days Date extubated: 11/20/15 Behavior/Cognition: Alert;Cooperative;Pleasant mood Oral Cavity Assessment: Within Functional Limits Oral Care Completed by SLP: Yes Oral Cavity -  Dentition: Adequate natural dentition Vision: Functional for self-feeding Self-Feeding Abilities: Able to feed self Patient Positioning: Upright in bed Baseline Vocal Quality: Normal Volitional Cough: Strong Volitional Swallow: Able to elicit    Oral/Motor/Sensory Function Overall Oral Motor/Sensory Function: Within functional limits   Ice Chips Ice chips: Within functional limits Presentation: Spoon   Thin Liquid Thin Liquid: Within functional limits Presentation: Cup;Spoon;Straw    Nectar Thick     Honey Thick     Puree Puree: Within functional limits Presentation: Self Fed   Solid   GO   Solid: Within functional limits Presentation: Self Fed    Functional Assessment Tool Used: Bedside swallow evaluation  Lindalou Hose Alfard Cochrane, MA, CCC-SLP 11/21/2015 9:02 AM

## 2015-11-21 NOTE — Progress Notes (Signed)
PULMONARY / CRITICAL CARE MEDICINE   Name: Abigail Hull MRN: [id number] DOB: 07/07/1960    ADMISSION DATE:  11/18/2015 CONSULTATION DATE:  11/18/2015  REF[facility]ING MD:  [facility]  CHIEF COMPLAINT:  Withdrawal/seizure  HISTORY OF PRESENT ILLNESS:   55 year old female past medical history significant for alcohol abuse and hypertension. 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 She typically drinks about 1 pint of hard alcohol daily, and has not had a drink for about 3 days now. She was last seen normal 5/16 at around noon. Since that time she has had significant nausea vomiting, blurry vision, difficulty ambulating, and apparent hallucinations. Her mental status continued to worsen since that time, until her husband witnessed her having a seizure-like episode and called EMS.  [facility] she was unresponsive and emergently intubated for airway protection. CT of the head showed bilateral areas of bilateral parietal occipital foci of poorly defined hypoattenuation. Ammonia level was also elevated. [facility] was [facility].  SUBJECTIVE:  Extubated 5/19, anxious overnight, no other complaints.  VITAL SIGNS: BP 155/105 mmHg  Pulse 102  Temp(Src) 99.3 F (37.4 C) (Oral)  Resp 15  Ht 5\' 3"  (1.6 m)  Wt 84.1 kg (185 lb 6.5 oz)  BMI 32.85 kg/m2  SpO2 96%  LMP  (LMP Unknown)  HEMODYNAMICS:    VENTILATOR SETTINGS: Vent Mode:  [-]  FiO2 (%):  [30 %] 30 %  INTAKE / OUTPUT: I/O last 3 completed shifts: In: 3828.9 [I.V.:2446.9; NG/GT:152; IV Piggyback:1230] Out: 1835 [Urine:1835]  PHYSICAL EXAMINATION: General:  Overweight female in NAD. Neuro:  Awake, interactive and moving all ext to commands, asking questions. HEENT:  [place]/AT, PERRL, no JVD Cardiovascular:  RRR, no MRG, Nl S1/S2. Lungs:  Bibasilar crackles. Fair ae.  Abdomen:   Soft, non-tender, non-distended Musculoskeletal: No acute deformity Skin:  Grossly intact  LABS:  BMET  Recent Labs Lab 11/19/15 0545 11/19/15 1350 11/20/15 0610 11/21/15 0600  NA 145  --  146* 146*  K 3.1* 3.8 3.2* 2.7*  CL 110  --  111 104  CO2 23  --  25 26  BUN <5*  --  8 <5*  CREATININE 0.77  --  0.61 0.70  GLUCOSE 99  --  107* 88    Electrolytes  Recent Labs Lab 11/19/15 0545 11/19/15 1350 11/20/15 0610 11/21/15 0600  CALCIUM 7.5*  --  8.0* 8.4*  MG 2.1  --  1.8 1.7  PHOS 1.9* 2.5 2.5 2.4*    CBC  Recent Labs Lab 11/19/15 1050 11/19/15 1700 11/20/15 0610 11/21/15 0600  WBC 4.9  --  6.7 5.3  HGB 9.5* 9.9* 9.8* 9.9*  HCT 29.4* 30.9* 31.4* 31.7*  PLT 68*  --  94* 122*    Coag's  Recent Labs Lab 11/18/15 0643 11/18/15 1200  APTT  --  31  INR 1.09  --     Sepsis Markers  Recent Labs Lab 11/18/15 0643 11/18/15 1200 11/19/15 0545 11/20/15 0610  LATICACIDVEN 0.8  --   --   --   PROCALCITON  --  0.27 0.25 0.17    ABG  Recent Labs Lab 11/18/15 0631  PHART 7.328*  PCO2ART 38.1  PO2ART 133*    Liver Enzymes  Recent Labs Lab 11/18/15 0643 11/20/15 0610  AST 182* 74*  ALT 51 35  ALKPHOS 66 71  BILITOT  1.4* 0.8  ALBUMIN 3.0* 2.5*    Cardiac Enzymes  Recent Labs Lab 11/18/15 0643 11/18/15 1200 11/18/15 1839  TROPONINI 6.07* 3.26* 1.90*    Glucose  Recent Labs Lab 11/20/15 1529 11/20/15 2031 11/20/15 2326 11/21/15 0414 11/21/15 0449 11/21/15 0804  GLUCAP 92 73 82 63* 112* 88    Imaging Mr Brain W Wo Contrast  11/20/2015  CLINICAL DATA:  Loss of consciousness. Mental status changes. Alcohol withdrawal. Abnormal CT scan. EXAM: MRI HEAD WITHOUT AND WITH CONTRAST TECHNIQUE: Multiplanar, multiecho pulse sequences of the brain and surrounding structures were obtained without and with intravenous contrast. CONTRAST:  17mL MULTIHANCE GADOBENATE DIMEGLUMINE 529 MG/ML IV SOLN COMPARISON:  CT head without contrast  11/18/2015 FINDINGS: Extensive posterior parietal and occipital subcortical T2 signal changes are present bilaterally. There is a punctate focus of restricted diffusion in the left parietal lobe on image 42 of series 4. At the focal area of cortical restricted diffusion measures 8 mm on image 23 of series 4 in the left occipital lobe. No other restricted diffusion is present. There is no associated hemorrhage. Minimal scattered subcortical white matter disease is present in the frontal lobes bilaterally as well, worse on the right. The ventricles are of normal size. The internal auditory canals are normal bilaterally. The brainstem and cerebellum are unremarkable. The globes and orbits are intact. The paranasal sinuses are clear. Fluid is present in the mastoid air cells bilaterally. No obstructing nasopharyngeal lesion is evident. The postcontrast images demonstrate no pathologic enhancement. Dedicated imaging of the temporal lobes demonstrate symmetric size and signal of the hippocampal structures. IMPRESSION: 1. Posterior reversible encephalopathy syndrome. 2. Two focal areas are restricted diffusion in the left parietal and occipital lobe may represent non reversible areas of infarction. 3. No associated hemorrhage. 4. Minimal subcortical white matter disease otherwise. Electronically Signed   By: Marin Roberts M.D.   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 CT head 5/17 > Symmetric parietal-occipital cortically based edema or infarct with pattern suggesting posterior reversible encephalopathy syndrome. No superimposed hemorrhage.  CULTURES: Blood culture 5/19 >> Blood culture 5/17 > MR staph species in 1 bottle; G(+) cocci in another Trache asp 5/17 >> some G(+) cocci MRSA (+) 5/17  ANTIBIOTICS: Unasyn 5/17 > 5/19 Vanc 5/20 >   SIGNIFICANT EVENTS: 5/17 seizure at home likely 2/2 etoh withdrawal. Intubated for airway.  Transferred to [facility]. CT scan c/w PRES.   LINES/TUBES: ETT 5/17 >5/20 R IJ 5/17 >   I reviewed CXR myself, ETT ok.  DISCUSSION:   ASSESSMENT / PLAN:  PULMONARY A: Inability to protect airway in setting of seizure. Acute hypoxemic respiratory failure 2/2 unable to protect airway, possible asp pna. MRSA (+); with staph in blood and G(+) cocci in trache asp  P:   Titrate O2 for sat of 88-92%. IS per RT protocol. OOB to chair. PT evaluation.  CARDIOVASCULAR A:  Demand ischemia; EF 45-50%; Echo with akinesis of inferior wall H/o HTN  P:  Telemetry monitoring Trend troponin > trending down ASA > observe plt Will d/c heparin. (recent mild UGIB) Will need Cards evaluation for demand ischemia, low N EF after acute illness resolves.  RENAL A:   Hypo K, Phos P:   Correct electrolytes BMET in AM KVO IVF  GASTROINTESTINAL A:   Chronic abdominal pain > ct scan with GB sludge/ possible Cholecystitis H/o cholelithiasis  Diarrhea (post starting TF) Likely with mild UGIB 2/2 stress  gastritis/PUD (had coffee ground NGT output on admit)  P:   Observe diarrhea. Diet per SLP recommendations. LFTs are better.  DIB protonix Observe hb/Hct.   HEMATOLOGIC A:   No acute issues Mild UGIB likely 2/2 stress gastritis/PUD  P:  D/c heparin drip SCDs  INFECTIOUS A:   Possible asp pna. MRSA (+) and MR Staph species in 1 blood culture and G(+) cocci in another blood culture and trache.   P:   Follow WBC and fever curve. As discussed above, add vanc to unasyn 5/20. Rpt blood culture today.   ENDOCRINE A:   No acute issues   P:   Follow glucose on chemistry   NEUROLOGIC A:   AMS prob 2/2 PRES Seizures in setting of EtOH withdrawal   P:   D/C sedation. Observe for withdrawal. CIWA protocol. MRI brain >> noted with PRESS and hypoattentuations. Neurology  Consult appreciated. Defer AED therapy to neurology PRN ativan for seizures  Discussed with bedside  RN.  FAMILY  - Updates: Patient and family updated bedside.  Transfer to SDU and to [facility] service with PCCM off 5/21.  Alyson Reedy, M.D. [facility] Pulmonary/Critical Care Medicine. Pager: [phone]. After hours pager: [phone].

## 2015-11-21 NOTE — Progress Notes (Signed)
eLink Physician-Brief Progress Note Patient Name: Doy HutchingSusan Stlouis DOB: [birth year] MRN: [id number]   Date of Service  11/21/2015  HPI/Events of Note  Anxiety, agitation  eICU Interventions  CIWA protocol for alcohol withdrawal.     Intervention Category Intermediate Interventions: Other: Minor Interventions: Agitation / anxiety - evaluation and management  Kimori Tartaglia 11/21/2015, 2:59 AM

## 2015-11-21 NOTE — Progress Notes (Signed)
Interval History:                                                                                                                      Abigail Hull is an 55 y.o. female patient with  alcohol abuse history, extubated today. She is following commands appropriately, denies any neurological symptoms. Her son is at bedside, he noted continued improvement in her level of functioning in mental status.  MRI of the brain was done today, showed evidence of posterior reversible encephalopathy with a couple of small areas of restricted diffusion in the left parietal and occipital lobes.   Patient had echocardiogram done on 11/18/2015, no cardiac source of emboli noted, EF 45-50%. No clinical episodes concerning for seizures.    no new neurology symptoms to address at this time.   Past Medical History: Past Medical History  Diagnosis Date  . Hypertension   . Depression   . Alcohol abuse   . Cholelithiasis   . Hyperlipemia     History reviewed. No pertinent past surgical history.  Family History: History reviewed. No pertinent family history.  Social History:   reports that she has been smoking E-cigarettes.  She has never used smokeless tobacco. She reports that she drinks about 42.0 oz of alcohol per week. Her drug history is not on file.  Allergies:  No Known Allergies   Medications:                                                                                                                         Current facility-administered medications:  .  0.9 %  sodium chloride infusion, 250 mL, Intravenous, PRN, Duayne Cal, NP .  0.9 %  sodium chloride infusion, , Intravenous, Continuous, Jose Alexis Frock, MD, Stopped at 11/20/15 2137 .  Ampicillin-Sulbactam (UNASYN) 3 g in sodium chloride 0.9 % 100 mL IVPB, 3 g, Intravenous, Q8H, Almon Hercules, RPH, 3 g at 11/21/15 1953 .  antiseptic oral rinse (CPC / CETYLPYRIDINIUM CHLORIDE 0.05%) solution 7 mL, 7 mL, Mouth Rinse, BID, Praveen Mannam,  MD, 7 mL at 11/21/15 1000 .  aspirin EC tablet 81 mg, 81 mg, Oral, Daily, [address] MD, 81 mg at 11/21/15 0932 .  Chlorhexidine Gluconate Cloth 2 % PADS 6 each, 6 each, Topical, Q0600, Nelda Bucks, MD, 6 each at 11/21/15 1108 .  dexmedetomidine (PRECEDEX) 200 MCG/50ML (4 mcg/mL) infusion, 0.4-1.2 mcg/kg/hr, Intravenous, Titrated, [address],  MD, Stopped at 11/20/15 1527 .  dextrose 5 % solution, , Intravenous, Continuous, Praveen Mannam, MD, Last Rate: 50 mL/hr at 11/20/15 2137 .  feeding supplement (PRO-STAT SUGAR FREE 64) liquid 60 mL, 60 mL, Per Tube, TID, Arlyss GandyHeather C Pitts, RD, 60 mL at 11/20/15 0943 .  fentaNYL (SUBLIMAZE) injection 100 mcg, 100 mcg, Intravenous, Q2H PRN, Duayne CalPaul W Hoffman, NP, 100 mcg at 11/20/15 0107 .  folic acid (FOLVITE) tablet 1 mg, 1 mg, Oral, Daily, Praveen Mannam, MD, 1 mg at 11/21/15 0932 .  heparin injection 5,000 Units, 5,000 Units, Subcutaneous, Q8H, Jose Alexis FrockAngelo A de Dios, MD, 5,000 Units at 11/21/15 1558 .  levETIRAcetam (KEPPRA) 1,000 mg in sodium chloride 0.9 % 100 mL IVPB, 1,000 mg, Intravenous, Q12H, Juanitta Earnhardt Daniel NonesNarayan Kaveer Deane Melick, MD, 1,000 mg at 11/21/15 1047 .  LORazepam (ATIVAN) tablet 1 mg, 1 mg, Oral, Q6H PRN **OR** LORazepam (ATIVAN) injection 1 mg, 1 mg, Intravenous, Q6H PRN, Praveen Mannam, MD, 1 mg at 11/21/15 0312 .  LORazepam (ATIVAN) injection 2 mg, 2 mg, Intravenous, Q5 min PRN, Duayne CalPaul W Hoffman, NP .  multivitamin with minerals tablet 1 tablet, 1 tablet, Oral, Daily, Praveen Mannam, MD, 1 tablet at 11/21/15 0932 .  mupirocin ointment (BACTROBAN) 2 % 1 application, 1 application, Nasal, BID, Nelda Bucksaniel J Feinstein, MD, 1 application at 11/21/15 [phone]  pantoprazole (PROTONIX) injection 40 mg, 40 mg, Intravenous, Q12H, Duayne CalPaul W Hoffman, NP, 40 mg at 11/21/15 0930 .  phenylephrine (NEO-SYNEPHRINE) 40 mg in dextrose 5 % 250 mL (0.16 mg/mL) infusion, 30-200 mcg/min, Intravenous, Continuous, Nelda Bucksaniel J Feinstein, MD, Stopped at 11/20/15  [phone] .  propofol (DIPRIVAN) 1000 MG/100ML infusion, 5-70 mcg/kg/min, Intravenous, Titrated, Jose Alexis FrockAngelo A de Dios, MD, Stopped at 11/20/15 1103 .  thiamine (VITAMIN B-1) tablet 100 mg, 100 mg, Oral, Daily, 100 mg at 11/21/15 0932 **OR** thiamine (B-1) injection 100 mg, 100 mg, Intravenous, Daily, Praveen Mannam, MD .  vancomycin (VANCOCIN) IVPB 1000 mg/200 mL premix, 1,000 mg, Intravenous, Q12H, Emi HolesJennifer S Markle, RPH, 1,000 mg at 11/21/15 1356   Neurologic Examination:                                                                                                     Today's Vitals   11/21/15 1400 11/21/15 1500 11/21/15 1556 11/21/15 1600  BP: 136/93 129/83    Pulse: 82 92  96  Temp:   99.2 F (37.3 C)   TempSrc:   Oral   Resp: 17 18  22   Height:      Weight:      SpO2: 96% 97%  97%    Evaluation of higher integrative functions including: Level of alertness: Alert,  Oriented to time, place and person. Able to do simple saturations such as serial sevens although requires some time, improving encephalopathy  Speech: Some hypophonia she was just extubated prior to my evaluation , no significant dysarthria, no aphasia noted.  Test the following cranial nerves: 2-12 grossly intact. No gross visual field deficits noted on confrontation testing  Motor examination:  full 5/5 motor strength in all 4  extremities   Lab Results: Basic Metabolic Panel:  Recent Labs Lab 11/18/15 0643 11/18/15 1200 11/19/15 0545 11/19/15 1350 11/20/15 0610 11/21/15 0600  NA 139  --  145  --  146* 146*  K 3.1* 3.2* 3.1* 3.8 3.2* 2.7*  CL 106  --  110  --  111 104  CO2 20*  --  23  --  25 26  GLUCOSE 93  --  99  --  107* 88  BUN 9  --  <5*  --  8 <5*  CREATININE 0.69  --  0.77  --  0.61 0.70  CALCIUM 7.3*  --  7.5*  --  8.0* 8.4*  MG 1.9 1.7 2.1  --  1.8 1.7  PHOS 2.8 3.0 1.9* 2.5 2.5 2.4*    Liver Function Tests:  Recent Labs Lab 11/18/15 0643 11/20/15 0610  AST 182* 74*  ALT 51 35   ALKPHOS 66 71  BILITOT 1.4* 0.8  PROT 5.1* 5.1*  ALBUMIN 3.0* 2.5*    Recent Labs Lab 11/18/15 0643  LIPASE 24  AMYLASE 64    Recent Labs Lab 11/18/15 0704  AMMONIA 27    CBC:  Recent Labs Lab 11/18/15 0643 11/19/15 1050 11/19/15 1700 11/20/15 0610 11/21/15 0600  WBC 5.3 4.9  --  6.7 5.3  NEUTROABS 4.4  --   --   --   --   HGB 11.0* 9.5* 9.9* 9.8* 9.9*  HCT 32.3* 29.4* 30.9* 31.4* 31.7*  MCV 98.2 99.3  --  101.3* 100.3*  PLT 64* 68*  --  94* 122*    Cardiac Enzymes:  Recent Labs Lab 11/18/15 0643 11/18/15 1200 11/18/15 1839  CKTOTAL 268* 252*  --   CKMB  --  27.0*  --   TROPONINI 6.07* 3.26* 1.90*    Lipid Panel:  Recent Labs Lab 11/18/15 0704 11/21/15 0600  TRIG 428* 298*    CBG:  Recent Labs Lab 11/21/15 0414 11/21/15 0449 11/21/15 0804 11/21/15 1118 11/21/15 1555  GLUCAP 63* 112* 88 131* 128*    Microbiology: Results for orders placed or performed during the hospital encounter of 11/18/15  MRSA PCR Screening     Status: Abnormal   Collection Time: 11/18/15  5:25 AM  Result Value Ref Range Status   MRSA by PCR POSITIVE (A) NEGATIVE Final    Comment:        The GeneXpert MRSA Assay (FDA approved for NASAL specimens only), is one component of a comprehensive MRSA colonization surveillance program. It is not intended to diagnose MRSA infection nor to guide or monitor treatment for MRSA infections. RESULT CALLED TO, READ BACK BY AND VERIFIED WITH: Vernie Shanks RN 9:35 11/18/15 ([email or web address])   Culture, respiratory (NON-Expectorated)     Status: Abnormal (Preliminary result)   Collection Time: 11/18/15 10:50 AM  Result Value Ref Range Status   Specimen Description TRACHEAL ASPIRATE  Final   Special Requests Normal  Final   Gram Stain   Final    ABUNDANT WBC PRESENT,BOTH PMN AND MONONUCLEAR NO SQUAMOUS EPITHELIAL CELLS SEEN RARE GRAM POSITIVE COCCI IN PAIRS Performed at [facility]    Culture (A)  Final     STAPHYLOCOCCUS AUREUS Note: RIFAMPIN AND GENTAMICIN SHOULD NOT BE USED AS SINGLE DRUGS FOR TREATMENT OF STAPH INFECTIONS. Performed at [facility]    Report Status PENDING  Incomplete  Culture, blood (routine x 2)     Status: Abnormal   Collection Time: 11/18/15 12:00 PM  Result Value Ref  Range Status   Specimen Description BLOOD RIGHT ARM  Final   Special Requests BOTTLES DRAWN AEROBIC AND ANAEROBIC 5CC  Final   Culture  Setup Time   Final    GRAM POSITIVE COCCI IN CLUSTERS AEROBIC BOTTLE ONLY Organism ID to follow CRITICAL RESULT CALLED TO, READ BACK BY AND VERIFIED WITH: E. MARTIN, PHARM D AT 1130 ON [id number] BY M. WILSON    Culture (A)  Final    STAPHYLOCOCCUS SPECIES (COAGULASE NEGATIVE) THE SIGNIFICANCE OF ISOLATING THIS ORGANISM FROM A SINGLE SET OF BLOOD CULTURES WHEN MULTIPLE SETS ARE DRAWN IS UNCERTAIN. PLEASE NOTIFY THE MICROBIOLOGY DEPARTMENT WITHIN ONE WEEK IF SPECIATION AND SENSITIVITIES ARE REQUIRED.    Report Status 11/21/2015 FINAL  Final  Blood Culture ID Panel (Reflexed)     Status: Abnormal   Collection Time: 11/18/15 12:00 PM  Result Value Ref Range Status   Enterococcus species NOT DETECTED NOT DETECTED Final   Vancomycin resistance NOT DETECTED NOT DETECTED Final   Listeria monocytogenes NOT DETECTED NOT DETECTED Final   Staphylococcus species DETECTED (A) NOT DETECTED Final    Comment: CRITICAL RESULT CALLED TO, READ BACK BY AND VERIFIED WITH: Veronda Prude.D. 11:30 11/19/15 ([email or web address])    Staphylococcus aureus NOT DETECTED NOT DETECTED Final   Methicillin resistance DETECTED (A) NOT DETECTED Final    Comment: CRITICAL RESULT CALLED TO, READ BACK BY AND VERIFIED WITH: EBrain Hilts.D. 11:30 11/19/15 ([email or web address])    Streptococcus species NOT DETECTED NOT DETECTED Final   Streptococcus agalactiae NOT DETECTED NOT DETECTED Final   Streptococcus pneumoniae NOT DETECTED NOT DETECTED Final   Streptococcus pyogenes NOT DETECTED NOT DETECTED Final    Acinetobacter baumannii NOT DETECTED NOT DETECTED Final   Enterobacteriaceae species NOT DETECTED NOT DETECTED Final   Enterobacter cloacae complex NOT DETECTED NOT DETECTED Final   Escherichia coli NOT DETECTED NOT DETECTED Final   Klebsiella oxytoca NOT DETECTED NOT DETECTED Final   Klebsiella pneumoniae NOT DETECTED NOT DETECTED Final   Proteus species NOT DETECTED NOT DETECTED Final   Serratia marcescens NOT DETECTED NOT DETECTED Final   Carbapenem resistance NOT DETECTED NOT DETECTED Final   Haemophilus influenzae NOT DETECTED NOT DETECTED Final   Neisseria meningitidis NOT DETECTED NOT DETECTED Final   Pseudomonas aeruginosa NOT DETECTED NOT DETECTED Final   Candida albicans NOT DETECTED NOT DETECTED Final   Candida glabrata NOT DETECTED NOT DETECTED Final   Candida krusei NOT DETECTED NOT DETECTED Final   Candida parapsilosis NOT DETECTED NOT DETECTED Final   Candida tropicalis NOT DETECTED NOT DETECTED Final  Culture, blood (routine x 2)     Status: None (Preliminary result)   Collection Time: 11/18/15 12:09 PM  Result Value Ref Range Status   Specimen Description BLOOD RIGHT HAND  Final   Special Requests BOTTLES DRAWN AEROBIC ONLY 7CC  Final   Culture NO GROWTH 3 DAYS  Final   Report Status PENDING  Incomplete  C difficile quick scan w PCR reflex     Status: None   Collection Time: 11/20/15 12:48 AM  Result Value Ref Range Status   C Diff antigen NEGATIVE NEGATIVE Final   C Diff toxin NEGATIVE NEGATIVE Final   C Diff interpretation Negative for toxigenic C. difficile  Final  Culture, blood (routine x 2)     Status: None (Preliminary result)   Collection Time: 11/20/15 10:40 AM  Result Value Ref Range Status   Specimen Description BLOOD RIGHT ANTECUBITAL  Final   Special Requests IN PEDIATRIC BOTTLE [facility]  Final   Culture NO GROWTH < 24 HOURS  Final   Report Status PENDING  Incomplete  Culture, blood (routine x 2)     Status: None (Preliminary result)   Collection Time:  11/20/15 10:46 AM  Result Value Ref Range Status   Specimen Description BLOOD LEFT ANTECUBITAL  Final   Special Requests IN PEDIATRIC BOTTLE 3CC  Final   Culture NO GROWTH < 24 HOURS  Final   Report Status PENDING  Incomplete    Imaging: Ct Head Wo Contrast  11/18/2015  CLINICAL DATA:  Acute encephalopathy. Rule out intracranial hemorrhage before starting heparin. Bilateral parietal occipital hypo attenuation on outside CT per EMR, presumed stroke EXAM: CT HEAD WITHOUT CONTRAST TECHNIQUE: Contiguous axial images were obtained from the base of the skull through the vertex without intravenous contrast. COMPARISON:  None. FINDINGS: Skull and Sinuses:Negative for fracture or destructive process. The visualized mastoids, middle ears, and imaged paranasal sinuses are clear. Advanced right TMJ arthritis. Visualized orbits: Negative. Brain: There is cortical and subcortical low-density fairly symmetrically in the occipital and parietal regions without superimposed hemorrhage. No high-density vessel sign. Elsewhere normal appearance of the brain. No hydrocephalus. IMPRESSION: Symmetric parietal-occipital cortically based edema or infarct with pattern suggesting posterior reversible encephalopathy syndrome. No superimposed hemorrhage. Electronically Signed   By: Marnee Spring M.D.   On: 11/18/2015 11:55   Mr Laqueta Jean ZO Contrast  11/20/2015  CLINICAL DATA:  Loss of consciousness. Mental status changes. Alcohol withdrawal. Abnormal CT scan. EXAM: MRI HEAD WITHOUT AND WITH CONTRAST TECHNIQUE: Multiplanar, multiecho pulse sequences of the brain and surrounding structures were obtained without and with intravenous contrast. CONTRAST:  17mL MULTIHANCE GADOBENATE DIMEGLUMINE 529 MG/ML IV SOLN COMPARISON:  CT head without contrast 11/18/2015 FINDINGS: Extensive posterior parietal and occipital subcortical T2 signal changes are present bilaterally. There is a punctate focus of restricted diffusion in the left parietal  lobe on image 42 of series 4. At the focal area of cortical restricted diffusion measures 8 mm on image 23 of series 4 in the left occipital lobe. No other restricted diffusion is present. There is no associated hemorrhage. Minimal scattered subcortical white matter disease is present in the frontal lobes bilaterally as well, worse on the right. The ventricles are of normal size. The internal auditory canals are normal bilaterally. The brainstem and cerebellum are unremarkable. The globes and orbits are intact. The paranasal sinuses are clear. Fluid is present in the mastoid air cells bilaterally. No obstructing nasopharyngeal lesion is evident. The postcontrast images demonstrate no pathologic enhancement. Dedicated imaging of the temporal lobes demonstrate symmetric size and signal of the hippocampal structures. IMPRESSION: 1. Posterior reversible encephalopathy syndrome. 2. Two focal areas are restricted diffusion in the left parietal and occipital lobe may represent non reversible areas of infarction. 3. No associated hemorrhage. 4. Minimal subcortical white matter disease otherwise. Electronically Signed   By: Marin Roberts M.D.   On: 11/20/2015 18:37   Ct Abdomen Pelvis W Contrast  11/18/2015  CLINICAL DATA:  Evaluate the source of blood from NG tube, intubated EXAM: CT ABDOMEN AND PELVIS WITH CONTRAST TECHNIQUE: Multidetector CT imaging of the abdomen and pelvis was performed using the standard protocol following bolus administration of intravenous contrast. CONTRAST:  ISOVUE-300 IOPAMIDOL (ISOVUE-300) INJECTION 61% COMPARISON:  None. FINDINGS: Lower chest: Bilateral tiny pleural effusion. There is bilateral lower lobe posterior small atelectasis or infiltrate. Hepatobiliary: Significant fatty infiltration of the liver. Mild hepatomegaly. There is thickening of gallbladder wall up to 6.5 mm. Probable  gallbladder sludge. There is a calcified gallstone in gallbladder neck region measures 9.5 mm.  No intrahepatic biliary ductal dilatation. No CBD dilatation. Pancreas: Enhanced pancreas is unremarkable. Spleen: Enhanced spleen is unremarkable. Adrenals/Urinary Tract: No adrenal gland mass. Enhanced kidneys are symmetrical in size. No hydronephrosis or hydroureter. Delayed renal images shows bilateral renal symmetrical excretion. Bilateral visualized proximal ureter is unremarkable. There is mild distended urinary bladder. A Foley catheter is noted within urinary bladder. Small amount of air within urinary bladder anteriorly is probable post instrumentation. Stomach/Bowel: There is no gastric outlet obstruction. There is a NG tube in place with tip in proximal stomach against the lateral gastric wall. There is no evidence of gastric wall hematoma adjacent to tip of the NG tube at. No evidence of intraluminal hematoma. No pericecal inflammation. Terminal ileum is unremarkable. Normal appendix partially visualized in axial image 72. There is no distal colonic obstruction. The right colon descending colon and sigmoid colon is empty collapsed. Some colonic gas noted in transverse colon. No small bowel obstruction. No thickened or dilated small bowel loops. Vascular/Lymphatic: There is no retroperitoneal or mesenteric adenopathy. No aortic aneurysm. Reproductive: The uterus and ovaries are unremarkable. The uterus is small size. No adnexal mass. Other: No ascites or free air. No retroperitoneal or mesenteric hematoma. No inguinal adenopathy. Musculoskeletal: No destructive bony lesions are noted. Degenerative changes are noted lumbar spine. Probable Schmorl's node deformity noted upper endplate of L4 vertebral body. Significant disc space flattening with vacuum disc phenomenon mild anterior spurring and endplate sclerotic changes at L1-L2 and L2-L3 level. Mild upper lumbar levoscoliosis. IMPRESSION: 1. There is significant fatty infiltration of the liver. Mild hepatomegaly. 2. There is thickening of gallbladder wall  up to 6.5 mm. Clinical correlation is necessary to exclude cholecystitis. Further correlation with gallbladder ultrasound could be performed as clinically warranted. Probable sludge within gallbladder lumen. There is a calcified gallstone in neck of the gallbladder measures 9.5 mm. 3. There is NG tube with tip in proximal stomach with tip against the lateral gastric wall. There is no evidence of intraluminal or mural hematoma. 4. Tiny bilateral pleural effusion with bilateral basilar posterior atelectasis or infiltrate. 5. Normal appendix.  No pericecal inflammation. 6. No small bowel obstruction. 7. Mild distended urinary bladder. Small amount of air within urinary bladder probable post instrumentation. The bladder catheter is noted. 8. Unremarkable uterus and ovaries. 9. Degenerative changes lumbar spine. Electronically Signed   By: Natasha Mead M.D.   On: 11/18/2015 12:59   Dg Chest Port 1 View  11/20/2015  CLINICAL DATA:  Respiratory failure. EXAM: PORTABLE CHEST 1 VIEW COMPARISON:  11/18/2015. FINDINGS: Endotracheal tube, left IJ line, NG tube in stable position. Left lower lobe infiltrate and/or atelectasis. Small left pleural effusion. No pneumothorax . IMPRESSION: 1. Lines and tubes in stable position. 2. Left lower lobe infiltrate and/or atelectasis. Small left pleural effusion. Electronically Signed   By: Maisie Fus  Register   On: 11/20/2015 07:37   Dg Chest Port 1 View  11/18/2015  CLINICAL DATA:  Status post central line placement EXAM: PORTABLE CHEST 1 VIEW COMPARISON:  11/18/2015 FINDINGS: Endotracheal tube and nasogastric catheter are again seen and stable. A new left jugular central line is noted with the catheter tip in the mid superior vena cava. No pneumothorax is noted. Some crowding of the vascular markings is noted due to a poor inspiratory effort. IMPRESSION: No pneumothorax following central line placement. Electronically Signed   By: Alcide Clever M.D.   On: 11/18/2015 11:19  Dg Chest  Port 1 View  11/18/2015  CLINICAL DATA:  Endotracheal tube.  History of hypertension. EXAM: PORTABLE CHEST 1 VIEW COMPARISON:  None. FINDINGS: Endotracheal tube placed with tip measuring 3.5 cm above the carina. Enteric tube is present with tip off of the field of view but below the left hemidiaphragm. Heart size and pulmonary vascularity are normal. Tortuous aorta. Prominent right paratracheal shadow is likely vascular. No focal airspace disease or consolidation in the lungs. No blunting of costophrenic angles. No pneumothorax. IMPRESSION: Appliances appear in satisfactory position. No evidence of active pulmonary disease. Electronically Signed   By: Burman Nieves M.D.   On: 11/18/2015 06:48    Assessment and plan:   Abigail Hull is an 55 y.o. female patient with PRES based on the MRI of the brain, has  alcohol abuse history.   she is at her baseline now, with recovered mental status, no focal neurological deficits, no new neurological symptoms status at this time.  MRI brain also showed 2 small areas of acute restricted diffusion in the left parietal and occipital lobe, likely sequelae of the PRES. She is on aspirin 81 mg daily, echocardiogram was completed on 11/18/2015. Continue telemetry monitoring to evaluate for atrial fibrillation.  Physical and occupation therapy follow up.  Neurology stroke team will continue to follow starting tomorrow morning.

## 2015-11-22 ENCOUNTER — Encounter (HOSPITAL_COMMUNITY): Payer: Self-pay | Admitting: Radiology

## 2015-11-22 ENCOUNTER — Inpatient Hospital Stay (HOSPITAL_COMMUNITY): Payer: MEDICAID

## 2015-11-22 DIAGNOSIS — F10931 Alcohol use, unspecified with withdrawal delirium: Secondary | ICD-10-CM | POA: Diagnosis present

## 2015-11-22 DIAGNOSIS — F1010 Alcohol abuse, uncomplicated: Secondary | ICD-10-CM | POA: Diagnosis present

## 2015-11-22 DIAGNOSIS — F10231 Alcohol dependence with withdrawal delirium: Secondary | ICD-10-CM

## 2015-11-22 DIAGNOSIS — I639 Cerebral infarction, unspecified: Secondary | ICD-10-CM | POA: Diagnosis present

## 2015-11-22 LAB — CBC
HCT: 29.6 % — ABNORMAL LOW (ref 36.0–46.0)
Hemoglobin: 9.4 g/dL — ABNORMAL LOW (ref 12.0–15.0)
MCH: 31.4 pg (ref 26.0–34.0)
MCHC: 31.8 g/dL (ref 30.0–36.0)
MCV: 99 fL (ref 78.0–100.0)
PLATELETS: 132 10*3/uL — AB (ref 150–400)
RBC: 2.99 MIL/uL — AB (ref 3.87–5.11)
RDW: 15.7 % — AB (ref 11.5–15.5)
WBC: 3.4 10*3/uL — AB (ref 4.0–10.5)

## 2015-11-22 LAB — BASIC METABOLIC PANEL
ANION GAP: 11 (ref 5–15)
BUN: <5 mg/dL — ABNORMAL LOW (ref 6–20)
CALCIUM: 8.1 mg/dL — AB (ref 8.9–10.3)
CO2: 29 mmol/L (ref 22–32)
Chloride: 104 mmol/L (ref 101–111)
Creatinine, Ser: 0.64 mg/dL (ref 0.44–1.00)
GLUCOSE: 99 mg/dL (ref 65–99)
POTASSIUM: 2.2 mmol/L — AB (ref 3.5–5.1)
Sodium: 144 mmol/L (ref 135–145)

## 2015-11-22 LAB — CULTURE, RESPIRATORY W GRAM STAIN

## 2015-11-22 LAB — CULTURE, RESPIRATORY: SPECIAL REQUESTS: NORMAL

## 2015-11-22 LAB — MAGNESIUM: Magnesium: 2 mg/dL (ref 1.7–2.4)

## 2015-11-22 LAB — LIPID PANEL
CHOL/HDL RATIO: 5.2 ratio
CHOLESTEROL: 186 mg/dL (ref 0–200)
HDL: 36 mg/dL — AB (ref >40)
LDL Cholesterol: 117 mg/dL — ABNORMAL HIGH (ref 0–99)
TRIGLYCERIDES: 166 mg/dL — AB (ref <150)
VLDL: 33 mg/dL (ref 0–40)

## 2015-11-22 LAB — TSH: TSH: 3.81 u[IU]/mL (ref 0.350–4.500)

## 2015-11-22 LAB — PHOSPHORUS: PHOSPHORUS: 3.3 mg/dL (ref 2.5–4.6)

## 2015-11-22 LAB — VITAMIN B12: Vitamin B-12: 1008 pg/mL — ABNORMAL HIGH (ref 180–914)

## 2015-11-22 LAB — HIV ANTIBODY (ROUTINE TESTING W REFLEX): HIV SCREEN 4TH GENERATION: NONREACTIVE

## 2015-11-22 LAB — GLUCOSE, CAPILLARY: GLUCOSE-CAPILLARY: 118 mg/dL — AB (ref 65–99)

## 2015-11-22 LAB — RPR: RPR Ser Ql: NONREACTIVE

## 2015-11-22 MED ORDER — SIMVASTATIN 20 MG PO TABS
20.0000 mg | ORAL_TABLET | Freq: Every day | ORAL | Status: DC
  Administered 2015-11-22 – 2015-11-24 (×3): 20 mg via ORAL
  Filled 2015-11-22 (×3): qty 1

## 2015-11-22 MED ORDER — POTASSIUM CHLORIDE 10 MEQ/50ML IV SOLN
10.0000 meq | INTRAVENOUS | Status: AC
  Administered 2015-11-22 (×3): 10 meq via INTRAVENOUS
  Filled 2015-11-22 (×2): qty 50

## 2015-11-22 MED ORDER — IOPAMIDOL (ISOVUE-370) INJECTION 76%
INTRAVENOUS | Status: AC
  Administered 2015-11-22: 50 mL
  Filled 2015-11-22: qty 50

## 2015-11-22 MED ORDER — POTASSIUM CHLORIDE 10 MEQ/50ML IV SOLN
INTRAVENOUS | Status: AC
  Administered 2015-11-22: 10 meq via INTRAVENOUS
  Filled 2015-11-22: qty 50

## 2015-11-22 MED ORDER — MENTHOL 3 MG MT LOZG: 1.0000 | LOZENGE | OROMUCOSAL | Status: DC | PRN

## 2015-11-22 MED ORDER — POTASSIUM CHLORIDE CRYS ER 20 MEQ PO TBCR
40.0000 meq | EXTENDED_RELEASE_TABLET | Freq: Three times a day (TID) | ORAL | Status: AC
  Administered 2015-11-22 (×3): 40 meq via ORAL
  Filled 2015-11-22 (×3): qty 2

## 2015-11-22 NOTE — Plan of Care (Signed)
Problem: Safety: Goal: Ability to remain free from injury will improve Outcome: Not Progressing Patient can be impulsive and has no regard for IVs that are infusing into patient's internal jugular IV access.  Patient has called when needed to use the bathroom but then does not wait for assistance.  Bed alarm goes off and patient attempting to get out of bed right after patient calls for staff assistance.  Throughout night shift RN has educated patient on calling and waiting for staff assistance.  RN explained to patient that she is very unsteady on her feet and she has many cords/lines attached to her body at this time and RN does not want her to fall and or injure herself.  Patient states understanding and agrees with calling and waiting for assistance and then continues to attempt to get out of bed unassisted.  Bed alarm on and call light within reach.  Will continue to monitor.

## 2015-11-22 NOTE — Progress Notes (Addendum)
Progress Note    Abigail Hull  WUJ:[id number] DOB: [birth year]  DOA: 11/18/2015 PCP: No primary care provider on file.    Brief Narrative:   Abigail Hull is an 55 y.o. female with a PMH of alcohol abuse and hypertension who was admitted by the critical care team on 11/18/15 for treatment of delirium tremens and alcohol withdrawal seizure complicated by the need for intubation to protect her airway will stop upon initial presentation, she was also found to have an elevated ammonia level and abnormal CT of the head.  Assessment/Plan:   Principal Problem:   Delirium tremens (HCC)/Acute encephalopathy/alcohol withdrawal seizure in the setting of alcohol abuse Patient was admitted and intubated for airway protection. Placed on propofol on admission. EEG done 11/18/15 and was abnormal with neuronal dysfunction and epileptogenic potential in the right frontal lobe. Placed on Ativan detox protocol post extubation. Continue supplement thiamine and folic acid. Continue Keppra. HIV, RPR pending.  Active Problems:   Hypotension requiring pressor support Patient was placed on Neo-Synephrine early on admission due to hypotension despite IV fluid challenge.    Elevated troponin/demand ischemia/NSTEMI/acute systolic CHF Patient was placed on heparin for concerns for NSTEMI which was continued through 11/21/15.  Troponin initially 6.07, trended down subsequently. 2-D echo done 11/18/15 and showed mildly reduced systolic function with an EF of 45-50 percent and akinesis of the basal inferior and inferoseptal myocardium. Continue aspirin. Will need formal cardiology evaluation given abnormal echocardiogram.    CVA (cerebral infarction)/acute embolic stroke/PRES Initial CT of the head showed findings concerning for acute CVA. MRI of the brain done 11/20/15 and showed 2 focal areas in the left parietal and occipital lobe consistent with infarction as well as PRES. 2-D echo done 11/18/15 and no cardiac source of  emboli found. Neurology following. Continue aspirin. Speech therapy evaluation done 11/21/15 and diet advanced. Appears to be back at her prior level of functioning with no focal neurological deficits. Further workup with CT angiogram of the head and neck ordered by neurology. Lipid panel showed a cholesterol of 186, LDL 117. We'll start statin. Hemoglobin A1c pending.    Respiratory failure (HCC) Intubated on admission for airway protection, subsequently extubated 11/21/15.    High anion gap metabolic acidosis Vigorously hydrated.    Possible aspiration pneumonia with MRSA positive sputum culture/MRSE bacteremia Initially placed on Unasyn. Vancomycin added 11/20/15. Repeat blood cultures done 11/21/15, negative to date.    Hypokalemia/hypophosphatemia Monitor and replaced as needed.    Obesity Evaluated by dietitian. Tube feedings initiated 11/19/15.    Pancytopenia Likely secondary to the toxic effects of alcohol on the bone marrow.   Family Communication/Anticipated D/C date and plan/Code Status   DVT prophylaxis: Heparin ordered. Code Status: Full Code.  Family Communication: Husband at the bedside. Disposition Plan: Home when work up completed, likely another 1-2 days.   Medical Consultants:    Neurology  Cardiology   Procedures:   EEG 11/18/15 2-D echo 11/18/15  Anti-Infectives:   Unasyn 11/18/15---> Vancomycin 11/20/15--->   Subjective:   Abigail Hull denies any focal motor deficits, speech difficulty, swallowing difficulty.  No chest pain.  No nausea or vomiting. Feels anxious.  Objective:    Filed Vitals:   11/21/15 2100 11/22/15 0005 11/22/15 0010 11/22/15 0446  BP: 128/77 153/99  149/87  Pulse: 90   91  Temp: 100.2 F (37.9 C) 99.9 F (37.7 C)  99.6 F (37.6 C)  TempSrc: Oral Oral    Resp: 20 21  23  Height:      Weight:    83.416 kg (183 lb 14.4 oz)  SpO2: 100%  97% 100%    Intake/Output Summary (Last 24 hours) at 11/22/15 0752 Last data filed  at 11/22/15 0700  Gross per 24 hour  Intake   2640 ml  Output    500 ml  Net   2140 ml   Filed Weights   11/20/15 0500 11/21/15 0400 11/22/15 0446  Weight: 84.5 kg (186 lb 4.6 oz) 84.1 kg (185 lb 6.5 oz) 83.416 kg (183 lb 14.4 oz)    Exam: General exam: Appears calm and comfortable.  Respiratory system: Clear to auscultation. Respiratory effort normal. Cardiovascular system: S1 & S2 heard, RRR. No JVD,  rubs, gallops or clicks. No murmurs. Gastrointestinal system: Abdomen is nondistended, soft and nontender. No organomegaly or masses felt. Normal bowel sounds heard. Central nervous system: Alert and oriented to self/place, but not date. No focal neurological deficits. Extremities: No clubbing, edema, or cyanosis. Skin: No rashes, lesions or ulcers Psychiatry: Judgement and insight appear normal. Mood & affect appropriate. Anxious.  Data Reviewed:   I have personally reviewed following labs and imaging studies:  Labs: Basic Metabolic Panel:  Recent Labs Lab 11/18/15 0643 11/18/15 1200 11/19/15 0545 11/19/15 1350 11/20/15 0610 11/21/15 0600 11/22/15 0500  NA 139  --  145  --  146* 146* 144  K 3.1* 3.2* 3.1* 3.8 3.2* 2.7* 2.2*  CL 106  --  110  --  111 104 104  CO2 20*  --  23  --  GLUCOSE 93  --  99  --  107* 88 99  BUN 9  --  <5*  --  8 <5* <5*  CREATININE 0.69  --  0.77  --  0.61 0.70 0.64  CALCIUM 7.3*  --  7.5*  --  8.0* 8.4* 8.1*  MG 1.9 1.7 2.1  --  1.8 1.7 2.0  PHOS 2.8 3.0 1.9* 2.5 2.5 2.4* 3.3   GFR Estimated Creatinine Clearance: 83.2 mL/min (by C-G formula based on Cr of 0.64). Liver Function Tests:  Recent Labs Lab 11/18/15 0643 11/20/15 0610  AST 182* 74*  ALT 51 35  ALKPHOS 66 71  BILITOT 1.4* 0.8  PROT 5.1* 5.1*  ALBUMIN 3.0* 2.5*    Recent Labs Lab 11/18/15 0643  LIPASE 24  AMYLASE 64    Recent Labs Lab 11/18/15 0704  AMMONIA 27   Coagulation profile  Recent Labs Lab 11/18/15 0643  INR 1.09    CBC:  Recent  Labs Lab 11/18/15 0643 11/19/15 1050 11/19/15 1700 11/20/15 0610 11/21/15 0600 11/22/15 0500  WBC 5.3 4.9  --  6.7 5.3 3.4*  NEUTROABS 4.4  --   --   --   --   --   HGB 11.0* 9.5* 9.9* 9.8* 9.9* 9.4*  HCT 32.3* 29.4* 30.9* 31.4* 31.7* 29.6*  MCV 98.2 99.3  --  101.3* 100.3* 99.0  PLT 64* 68*  --  94* 122* 132*   Cardiac Enzymes:  Recent Labs Lab 11/18/15 0643 11/18/15 1200 11/18/15 1839  CKTOTAL 268* 252*  --   CKMB  --  27.0*  --   TROPONINI 6.07* 3.26* 1.90*   BNP (last 3 results) No results for input(s): PROBNP in the last 8760 hours. CBG:  Recent Labs Lab 11/21/15 0449 11/21/15 0804 11/21/15 1118 11/21/15 1555 11/22/15 0126  GLUCAP 112* 88 131* 128* 118*   D-Dimer: No results for input(s): DDIMER in the  last 72 hours. Hgb A1c: No results for input(s): HGBA1C in the last 72 hours. Lipid Profile:  Recent Labs  11/21/15 0600 11/22/15 0140  CHOL  --  186  HDL  --  36*  LDLCALC  --  117*  TRIG 298* 166*  CHOLHDL  --  5.2   Thyroid function studies:  Recent Labs  11/22/15 0242  TSH 3.810   Anemia work up:  Recent Labs  11/22/15 0140  VITAMINB12 1008*   Sepsis Labs:  Recent Labs Lab 11/18/15 0643 11/18/15 1200 11/19/15 0545 11/19/15 1050 11/20/15 0610 11/21/15 0600 11/22/15 0500  PROCALCITON  --  0.27 0.25  --  0.17  --   --   WBC 5.3  --   --  4.9 6.7 5.3 3.4*  LATICACIDVEN 0.8  --   --   --   --   --   --    Urine analysis: No results found for: COLORURINE, APPEARANCEUR, LABSPEC, PHURINE, GLUCOSEU, HGBUR, BILIRUBINUR, KETONESUR, PROTEINUR, UROBILINOGEN, NITRITE, LEUKOCYTESUR Microbiology Recent Results (from the past 240 hour(s))  MRSA PCR Screening     Status: Abnormal   Collection Time: 11/18/15  5:25 AM  Result Value Ref Range Status   MRSA by PCR POSITIVE (A) NEGATIVE Final    Comment:        The GeneXpert MRSA Assay (FDA approved for NASAL specimens only), is one component of a comprehensive MRSA  colonization surveillance program. It is not intended to diagnose MRSA infection nor to guide or monitor treatment for MRSA infections. RESULT CALLED TO, READ BACK BY AND VERIFIED WITH: Vernie ShanksK. MEYRAN RN 9:35 11/18/15 ([email or web address])   Culture, respiratory (NON-Expectorated)     Status: Abnormal (Preliminary result)   Collection Time: 11/18/15 10:50 AM  Result Value Ref Range Status   Specimen Description TRACHEAL ASPIRATE  Final   Special Requests Normal  Final   Gram Stain   Final    ABUNDANT WBC PRESENT,BOTH PMN AND MONONUCLEAR NO SQUAMOUS EPITHELIAL CELLS SEEN RARE GRAM POSITIVE COCCI IN PAIRS Performed at [facility]    Culture (A)  Final    STAPHYLOCOCCUS AUREUS Note: RIFAMPIN AND GENTAMICIN SHOULD NOT BE USED AS SINGLE DRUGS FOR TREATMENT OF STAPH INFECTIONS. Performed at [facility]    Report Status PENDING  Incomplete  Culture, blood (routine x 2)     Status: Abnormal   Collection Time: 11/18/15 12:00 PM  Result Value Ref Range Status   Specimen Description BLOOD RIGHT ARM  Final   Special Requests BOTTLES DRAWN AEROBIC AND ANAEROBIC 5CC  Final   Culture  Setup Time   Final    GRAM POSITIVE COCCI IN CLUSTERS AEROBIC BOTTLE ONLY Organism ID to follow CRITICAL RESULT CALLED TO, READ BACK BY AND VERIFIED WITH: E. MARTIN, PHARM D AT 1130 ON [id number] BY M. WILSON    Culture (A)  Final    STAPHYLOCOCCUS SPECIES (COAGULASE NEGATIVE) THE SIGNIFICANCE OF ISOLATING THIS ORGANISM FROM A SINGLE SET OF BLOOD CULTURES WHEN MULTIPLE SETS ARE DRAWN IS UNCERTAIN. PLEASE NOTIFY THE MICROBIOLOGY DEPARTMENT WITHIN ONE WEEK IF SPECIATION AND SENSITIVITIES ARE REQUIRED.    Report Status 11/21/2015 FINAL  Final  Blood Culture ID Panel (Reflexed)     Status: Abnormal   Collection Time: 11/18/15 12:00 PM  Result Value Ref Range Status   Enterococcus species NOT DETECTED NOT DETECTED Final   Vancomycin resistance NOT DETECTED NOT DETECTED Final   Listeria monocytogenes NOT DETECTED  NOT DETECTED Final   Staphylococcus species DETECTED (A)  NOT DETECTED Final    Comment: CRITICAL RESULT CALLED TO, READ BACK BY AND VERIFIED WITH: EBrain Hilts.D. 11:30 11/19/15 ([email or web address])    Staphylococcus aureus NOT DETECTED NOT DETECTED Final   Methicillin resistance DETECTED (A) NOT DETECTED Final    Comment: CRITICAL RESULT CALLED TO, READ BACK BY AND VERIFIED WITH: EBrain Hilts.D. 11:30 11/19/15 ([email or web address])    Streptococcus species NOT DETECTED NOT DETECTED Final   Streptococcus agalactiae NOT DETECTED NOT DETECTED Final   Streptococcus pneumoniae NOT DETECTED NOT DETECTED Final   Streptococcus pyogenes NOT DETECTED NOT DETECTED Final   Acinetobacter baumannii NOT DETECTED NOT DETECTED Final   Enterobacteriaceae species NOT DETECTED NOT DETECTED Final   Enterobacter cloacae complex NOT DETECTED NOT DETECTED Final   Escherichia coli NOT DETECTED NOT DETECTED Final   Klebsiella oxytoca NOT DETECTED NOT DETECTED Final   Klebsiella pneumoniae NOT DETECTED NOT DETECTED Final   Proteus species NOT DETECTED NOT DETECTED Final   Serratia marcescens NOT DETECTED NOT DETECTED Final   Carbapenem resistance NOT DETECTED NOT DETECTED Final   Haemophilus influenzae NOT DETECTED NOT DETECTED Final   Neisseria meningitidis NOT DETECTED NOT DETECTED Final   Pseudomonas aeruginosa NOT DETECTED NOT DETECTED Final   Candida albicans NOT DETECTED NOT DETECTED Final   Candida glabrata NOT DETECTED NOT DETECTED Final   Candida krusei NOT DETECTED NOT DETECTED Final   Candida parapsilosis NOT DETECTED NOT DETECTED Final   Candida tropicalis NOT DETECTED NOT DETECTED Final  Culture, blood (routine x 2)     Status: None (Preliminary result)   Collection Time: 11/18/15 12:09 PM  Result Value Ref Range Status   Specimen Description BLOOD RIGHT HAND  Final   Special Requests BOTTLES DRAWN AEROBIC ONLY 7CC  Final   Culture NO GROWTH 3 DAYS  Final   Report Status PENDING  Incomplete  C difficile quick  scan w PCR reflex     Status: None   Collection Time: 11/20/15 12:48 AM  Result Value Ref Range Status   C Diff antigen NEGATIVE NEGATIVE Final   C Diff toxin NEGATIVE NEGATIVE Final   C Diff interpretation Negative for toxigenic C. difficile  Final  Culture, blood (routine x 2)     Status: None (Preliminary result)   Collection Time: 11/20/15 10:40 AM  Result Value Ref Range Status   Specimen Description BLOOD RIGHT ANTECUBITAL  Final   Special Requests IN PEDIATRIC BOTTLE 2CC  Final   Culture NO GROWTH < 24 HOURS  Final   Report Status PENDING  Incomplete  Culture, blood (routine x 2)     Status: None (Preliminary result)   Collection Time: 11/20/15 10:46 AM  Result Value Ref Range Status   Specimen Description BLOOD LEFT ANTECUBITAL  Final   Special Requests IN PEDIATRIC BOTTLE 3CC  Final   Culture NO GROWTH < 24 HOURS  Final   Report Status PENDING  Incomplete    Radiology: Mr Lodema Pilot Contrast  11/20/2015  CLINICAL DATA:  Loss of consciousness. Mental status changes. Alcohol withdrawal. Abnormal CT scan. EXAM: MRI HEAD WITHOUT AND WITH CONTRAST TECHNIQUE: Multiplanar, multiecho pulse sequences of the brain and surrounding structures were obtained without and with intravenous contrast. CONTRAST:  17mL MULTIHANCE GADOBENATE DIMEGLUMINE 529 MG/ML IV SOLN COMPARISON:  CT head without contrast 11/18/2015 FINDINGS: Extensive posterior parietal and occipital subcortical T2 signal changes are present bilaterally. There is a punctate focus of restricted diffusion in the left parietal lobe on image 42 of series 4. At the  focal area of cortical restricted diffusion measures 8 mm on image 23 of series 4 in the left occipital lobe. No other restricted diffusion is present. There is no associated hemorrhage. Minimal scattered subcortical white matter disease is present in the frontal lobes bilaterally as well, worse on the right. The ventricles are of normal size. The internal auditory canals are  normal bilaterally. The brainstem and cerebellum are unremarkable. The globes and orbits are intact. The paranasal sinuses are clear. Fluid is present in the mastoid air cells bilaterally. No obstructing nasopharyngeal lesion is evident. The postcontrast images demonstrate no pathologic enhancement. Dedicated imaging of the temporal lobes demonstrate symmetric size and signal of the hippocampal structures. IMPRESSION: 1. Posterior reversible encephalopathy syndrome. 2. Two focal areas are restricted diffusion in the left parietal and occipital lobe may represent non reversible areas of infarction. 3. No associated hemorrhage. 4. Minimal subcortical white matter disease otherwise. Electronically Signed   By: Marin Roberts M.D.   On: 11/20/2015 18:37    Medications:   . ampicillin-sulbactam (UNASYN) IV  3 g Intravenous Q8H  . antiseptic oral rinse  7 mL Mouth Rinse BID  . aspirin EC  81 mg Oral Daily  . Chlorhexidine Gluconate Cloth  6 each Topical Q0600  . feeding supplement (PRO-STAT SUGAR FREE 64)  60 mL Per Tube TID  . folic acid  1 mg Oral Daily  . heparin subcutaneous  5,000 Units Subcutaneous Q8H  . levETIRAcetam  1,000 mg Intravenous Q12H  . multivitamin with minerals  1 tablet Oral Daily  . mupirocin ointment  1 application Nasal BID  . pantoprazole (PROTONIX) IV  40 mg Intravenous Q12H  . potassium chloride  40 mEq Oral TID  . thiamine  100 mg Oral Daily   Or  . thiamine  100 mg Intravenous Daily  . vancomycin  1,000 mg Intravenous Q12H   Continuous Infusions: . sodium chloride Stopped (11/20/15 2137)  . dexmedetomidine Stopped (11/20/15 1527)  . dextrose 50 mL/hr at 11/21/15 1958  . phenylephrine (NEO-SYNEPHRINE) Adult infusion Stopped (11/20/15 0944)  . propofol (DIPRIVAN) infusion Stopped (11/20/15 1103)    Time spent: 35 minutes.  The patient is medically complex with multiple co-morbidities and is at high risk for clinical deterioration and requires high complexity  decision making.    LOS: 4 days   Kyros Salzwedel  [facility] Pager [phone]. If unable to reach me by pager, please call my cell phone at [phone].  *Please refer to [email or web address], password [email or web address] to get updated schedule on who will round on this patient, as hospitalists switch teams weekly. If 7PM-7AM, please contact night-coverage at [email or web address], password [email or web address] for any overnight needs.  11/22/2015, 7:52 AM

## 2015-11-22 NOTE — Progress Notes (Addendum)
STROKE TEAM PROGRESS NOTE   SUBJECTIVE (INTERVAL HISTORY) Her husband and Dr. Darnelle Catalan are at the bedside.  Overall she feels her condition is rapidly improving. Neuro intact at this time.    OBJECTIVE Temp:  [99.2 F (37.3 C)-100.2 F (37.9 C)] 99.4 F (37.4 C) (05/21 1148) Pulse Rate:  [82-96] 90 (05/21 1148) Cardiac Rhythm:  [-] Normal sinus rhythm (05/21 0705) Resp:  [17-23] 19 (05/21 1148) BP: (128-153)/(77-99) 140/82 mmHg (05/21 1148) SpO2:  [96 %-100 %] 100 % (05/21 1148) Weight:  [83.416 kg (183 lb 14.4 oz)] 83.416 kg (183 lb 14.4 oz) (05/21 0446)  CBC:  Recent Labs Lab 11/18/15 0643  11/21/15 0600 11/22/15 0500  WBC 5.3  < > 5.3 3.4*  NEUTROABS 4.4  --   --   --   HGB 11.0*  < > 9.9* 9.4*  HCT 32.3*  < > 31.7* 29.6*  MCV 98.2  < > 100.3* 99.0  PLT 64*  < > 122* 132*  < > = values in this interval not displayed.  Basic Metabolic Panel:   Recent Labs Lab 11/21/15 0600 11/22/15 0500  NA 146* 144  K 2.7* 2.2*  CL 104 104  CO2 26 29  GLUCOSE 88 99  BUN <5* <5*  CREATININE 0.70 0.64  CALCIUM 8.4* 8.1*  MG 1.7 2.0  PHOS 2.4* 3.3    Lipid Panel:     Component Value Date/Time   CHOL 186 11/22/2015 0140   TRIG 166* 11/22/2015 0140   HDL 36* 11/22/2015 0140   CHOLHDL 5.2 11/22/2015 0140   VLDL 33 11/22/2015 0140   LDLCALC 117* 11/22/2015 0140   HgbA1c: No results found for: HGBA1C Urine Drug Screen:     Component Value Date/Time   LABOPIA NONE DETECTED 11/18/2015 1258   COCAINSCRNUR NONE DETECTED 11/18/2015 1258   LABBENZ POSITIVE* 11/18/2015 1258   AMPHETMU NONE DETECTED 11/18/2015 1258   THCU NONE DETECTED 11/18/2015 1258   LABBARB NONE DETECTED 11/18/2015 1258      IMAGING I have personally reviewed the radiological images below and agree with the radiology interpretations.  Mr Abigail Hull Contrast 11/20/2015   1. Posterior reversible encephalopathy syndrome.  2. Two focal areas are restricted diffusion in the left parietal and occipital lobe  may represent non reversible areas of infarction.  3. No associated hemorrhage.  4. Minimal subcortical white matter disease otherwise.   CTA of head and neck  1. Persistent subcortical white matter hypoattenuation involving the posterior parietal and occipital lobes bilaterally compatible with posterior reversible encephalopathy syndrome. 2. Evolution of remote nonhemorrhagic infarct of the left occipital pole. 3. 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 4. Atherosclerotic changes of both carotid bifurcations without significant stenosis. 5. Multilevel spondylosis of the cervical spine as described.  Transthoracic echocardiogram 11/18/2015 Study Conclusions - Left ventricle: The cavity size was normal. Wall thickness was  normal. Systolic function was mildly reduced. The estimated  ejection fraction was in the range of 45% to 50%. There is  akinesis of the basalinferior and inferoseptal myocardium. - Mitral valve: Calcified annulus. There was mild regurgitation. Impressions: - No cardiac source of emboli was indentified.  EEG 11/19/2015 Findings:  The background rhythm was 10-12 Hz alpha . Frequent right frontal abnormal epileptiform discharges in the form of sharps with phase reversal and right hemispheric field were noted, without spread to left hemisphere. No definite evidence of electrographic seizures were noted during  this recording.  Impression:  Abnormal routine inpatient EEG suggestive of neuronal dysfunction with epileptogenic potential in right frontal lobe as described. No seizures noted. Clinical correlation is recommended .    PHYSICAL EXAM  Temp:  [99.4 F (37.4 C)-100.2 F (37.9 C)] 99.4 F (37.4 C) (05/21 1148) Pulse Rate:  [89-91] 90 (05/21 1148) Resp:  [19-23] 19 (05/21 1148) BP: (128-153)/(77-99) 140/82 mmHg (05/21 1148) SpO2:  [97 %-100 %]  100 % (05/21 1148) Weight:  [183 lb 14.4 oz (83.416 kg)] 183 lb 14.4 oz (83.416 kg) (05/21 0446)  General - Well nourished, well developed, in no apparent distress.  Ophthalmologic - Fundi not visualized due to eye movement.  Cardiovascular - Regular rate and rhythm.  Mental Status -  Level of arousal and orientation to time, place, and person were intact. Language including expression, naming, repetition, comprehension was assessed and found intact. Fund of Knowledge was assessed and was intact.  Cranial Nerves II - XII - II - Visual field intact OU. III, IV, VI - Extraocular movements intact. V - Facial sensation intact bilaterally. VII - Facial movement intact bilaterally. VIII - Hearing & vestibular intact bilaterally. X - Palate elevates symmetrically. XI - Chin turning & shoulder shrug intact bilaterally. XII - Tongue protrusion intact.  Motor Strength - The patient's strength was normal in all extremities and pronator drift was absent.  Bulk was normal and fasciculations were absent.   Motor Tone - Muscle tone was assessed at the neck and appendages and was normal.  Reflexes - The patient's reflexes were 1+ in all extremities and she had no pathological reflexes.  Sensory - Light touch, temperature/pinprick were assessed and were symmetrical.    Coordination - The patient had normal movements in the hands with no ataxia or dysmetria.  Tremor was absent.  Gait and Station - deferred due to safety concerns    ASSESSMENT/PLAN Abigail Hull is a 55 y.o. female with history of hypertension, alcohol abuse, depression, hyperlipidemia, and cholelithiasis presenting with altered mental status, visual disturbance, unsteady gait, hallucinations, possible seizure, nausea, vomiting, and unresponsiveness requiring intubation . She did not receive IV t-PA due to late presentation.  Posterior punctate infarcts due to PRES  Resultant  Resolved   MRI - Posterior reversible  encephalopathy syndrome. Two focal punctate infarcts at left MCA/PCA and right MCA/PCA/ACA are likely due to PRES.   CTA of the head and neck - unremarkable  Carotid Doppler - refer to CTA of the neck  2D Echo - EF 45-50%.  LDL - 117  HgbA1c pending  VTE prophylaxis - subcutaneous heparin Diet regular Room service appropriate?: Yes; Fluid consistency:: Thin  No antithrombotic prior to admission, now on aspirin 81 mg daily. Continue ASA on discharge.  Patient counseled to be compliant with her antithrombotic medications  Ongoing aggressive stroke risk factor management  Therapy recommendations: Pending  Disposition: Pending  PRES  MRI confirmed PRED picture  Seizure could be due to PRES or alcohol withdraw  EEG - abnormal right frontal IEDs  On keppra 1000mg  bid  Due to abnormal EEG, will continue keppra on discharge  Repeat EEG as outpt on follow up  Alcohol abuse  Alcohol withdraw seizure  DT  Currently at baseline  Alcohol counseling provided  Elevated troponin  As high as 6.07  Trending down  Cardiology on board  EF 45-50%  Will do cardiac cath on Tuesday.  Hypertension  Stable  BP goal normotensive  Hyperlipidemia  Home meds: No lipid lowering medications  prior to admission  LDL 117, goal < 70  Now on Zocor 20 mg daily  Continue statin at discharge  Other Stroke Risk Factors  Cigarette smoker - advised to stop smoking  Obesity, Body mass index is 32.58 kg/(m^2).   Other Active Problems  Pancytopenia  Hypokalemia - 2.2  Hospital day # 4  Neurology will sign off. Please call with questions. Pt will follow up with Darrol Angel NP at [facility] in about 2 months. Thanks for the consult.   Marvel Plan, MD PhD Stroke Neurology 11/22/2015 4:48 PM    To contact Stroke Continuity provider, please refer to [email or web address]. After hours, contact General Neurology

## 2015-11-22 NOTE — Consult Note (Signed)
Cardiologist:  New Reason for Consult: Elevated troponin, acute systolic heart failure Referring Physician: Dr. Feliz Beam Abigail Hull is an 55 y.o. female.  HPI:   Patient is a 55 year old female history of hypertension, depression, alcohol abuse, cholelithiasis and hyperlipidemia.  She presented with withdrawal seizures.  PRES.  She had an echocardiogram this admission revealed ejection fraction of 45-50% with akinesis of the basal inferior and inferior septal myocardium. Mild MR.  EKG shows normal sinus rhythm nonspecific T-wave changes.  Troponin elevated with a peak of 6.07 on May 17.  She was gastric occult blood positive.    MRI brain yesterday:  posterior reversible encephalopathy with a couple of small areas of restricted diffusion in the left parietal and occipital lobes.     She described her symptoms prior to admission which did not include chest pain or pressure, but did include hallucinations.  No SOB, orthopnea, LEE.  She's had chronic back pain which she attributes to gall stones.     Past Medical History  Diagnosis Date  . Hypertension   . Depression   . Alcohol abuse   . Cholelithiasis   . Hyperlipemia     History reviewed. No pertinent past surgical history.  Family History  Problem Relation Age of Onset  . Coronary artery disease Mother     Social History:  reports that she has been smoking E-cigarettes.  She has never used smokeless tobacco. She reports that she drinks about 42.0 oz of alcohol per week. Her drug history is not on file.  Allergies: No Known Allergies  Medications:  Scheduled Meds: . ampicillin-sulbactam (UNASYN) IV  3 g Intravenous Q8H  . antiseptic oral rinse  7 mL Mouth Rinse BID  . aspirin EC  81 mg Oral Daily  . Chlorhexidine Gluconate Cloth  6 each Topical Q0600  . feeding supplement (PRO-STAT SUGAR FREE 64)  60 mL Per Tube TID  . folic acid  1 mg Oral Daily  . heparin subcutaneous  5,000 Units Subcutaneous Q8H  . levETIRAcetam  1,000 mg  Intravenous Q12H  . multivitamin with minerals  1 tablet Oral Daily  . mupirocin ointment  1 application Nasal BID  . pantoprazole (PROTONIX) IV  40 mg Intravenous Q12H  . potassium chloride  40 mEq Oral TID  . simvastatin  20 mg Oral q1800  . thiamine  100 mg Oral Daily   Or  . thiamine  100 mg Intravenous Daily  . vancomycin  1,000 mg Intravenous Q12H   Continuous Infusions: . sodium chloride Stopped (11/20/15 2137)  . dextrose 50 mL/hr at 11/21/15 1958   PRN Meds:.sodium chloride, fentaNYL (SUBLIMAZE) injection, LORazepam **OR** LORazepam, LORazepam, menthol-cetylpyridinium   Results for orders placed or performed during the hospital encounter of 11/18/15 (from the past 48 hour(s))  Glucose, capillary     Status: None   Collection Time: 11/20/15  8:31 PM  Result Value Ref Range   Glucose-Capillary 73 65 - 99 mg/dL  Glucose, capillary     Status: None   Collection Time: 11/20/15 11:26 PM  Result Value Ref Range   Glucose-Capillary 82 65 - 99 mg/dL  Glucose, capillary     Status: Abnormal   Collection Time: 11/21/15  4:14 AM  Result Value Ref Range   Glucose-Capillary 63 (L) 65 - 99 mg/dL  Glucose, capillary     Status: Abnormal   Collection Time: 11/21/15  4:49 AM  Result Value Ref Range   Glucose-Capillary 112 (H) 65 - 99 mg/dL  Triglycerides  Status: Abnormal   Collection Time: 11/21/15  6:00 AM  Result Value Ref Range   Triglycerides 298 (H) <150 mg/dL  Basic metabolic panel     Status: Abnormal   Collection Time: 11/21/15  6:00 AM  Result Value Ref Range   Sodium 146 (H) 135 - 145 mmol/L   Potassium 2.7 (LL) 3.5 - 5.1 mmol/L    Comment: CRITICAL RESULT CALLED TO, READ BACK BY AND VERIFIED WITH: JENNIFER CARMICHAEL,RN AT 4536 11/21/15 BY ZBEECH.    Chloride 104 101 - 111 mmol/L   CO2 26 22 - 32 mmol/L   Glucose, Bld 88 65 - 99 mg/dL   BUN <5 (L) 6 - 20 mg/dL   Creatinine, Ser 0.70 0.44 - 1.00 mg/dL   Calcium 8.4 (L) 8.9 - 10.3 mg/dL   GFR calc non Af Amer  >60 >60 mL/min   GFR calc Af Amer >60 >60 mL/min    Comment: (NOTE) The eGFR has been calculated using the CKD EPI equation. This calculation has not been validated in all clinical situations. eGFR's persistently <60 mL/min signify possible Chronic Kidney Disease.    Anion gap 16 (H) 5 - 15  CBC     Status: Abnormal   Collection Time: 11/21/15  6:00 AM  Result Value Ref Range   WBC 5.3 4.0 - 10.5 K/uL   RBC 3.16 (L) 3.87 - 5.11 MIL/uL   Hemoglobin 9.9 (L) 12.0 - 15.0 g/dL   HCT 31.7 (L) 36.0 - 46.0 %   MCV 100.3 (H) 78.0 - 100.0 fL   MCH 31.3 26.0 - 34.0 pg   MCHC 31.2 30.0 - 36.0 g/dL   RDW 15.6 (H) 11.5 - 15.5 %   Platelets 122 (L) 150 - 400 K/uL  Magnesium     Status: None   Collection Time: 11/21/15  6:00 AM  Result Value Ref Range   Magnesium 1.7 1.7 - 2.4 mg/dL  Phosphorus     Status: Abnormal   Collection Time: 11/21/15  6:00 AM  Result Value Ref Range   Phosphorus 2.4 (L) 2.5 - 4.6 mg/dL  Glucose, capillary     Status: None   Collection Time: 11/21/15  8:04 AM  Result Value Ref Range   Glucose-Capillary 88 65 - 99 mg/dL  Glucose, capillary     Status: Abnormal   Collection Time: 11/21/15 11:18 AM  Result Value Ref Range   Glucose-Capillary 131 (H) 65 - 99 mg/dL  Glucose, capillary     Status: Abnormal   Collection Time: 11/21/15  3:55 PM  Result Value Ref Range   Glucose-Capillary 128 (H) 65 - 99 mg/dL  Glucose, capillary     Status: Abnormal   Collection Time: 11/22/15  1:26 AM  Result Value Ref Range   Glucose-Capillary 118 (H) 65 - 99 mg/dL  Lipid panel     Status: Abnormal   Collection Time: 11/22/15  1:40 AM  Result Value Ref Range   Cholesterol 186 0 - 200 mg/dL   Triglycerides 166 (H) <150 mg/dL   HDL 36 (L) >40 mg/dL   Total CHOL/HDL Ratio 5.2 RATIO   VLDL 33 0 - 40 mg/dL   LDL Cholesterol 117 (H) 0 - 99 mg/dL    Comment:        Total Cholesterol/HDL:CHD Risk Coronary Heart Disease Risk Table                     Men   Women  1/2 Average Risk  3.4   3.3  Average Risk       5.0   4.4  2 X Average Risk   9.6   7.1  3 X Average Risk  23.4   11.0        Use the calculated Patient Ratio above and the CHD Risk Table to determine the patient's CHD Risk.        ATP III CLASSIFICATION (LDL):  <100     mg/dL   Optimal  100-129  mg/dL   Near or Above                    Optimal  130-159  mg/dL   Borderline  160-189  mg/dL   High  >190     mg/dL   Very High   Vitamin B12     Status: Abnormal   Collection Time: 11/22/15  1:40 AM  Result Value Ref Range   Vitamin B-12 1008 (H) 180 - 914 pg/mL    Comment: (NOTE) This assay is not validated for testing neonatal or myeloproliferative syndrome specimens for Vitamin B12 levels.   TSH     Status: None   Collection Time: 11/22/15  2:42 AM  Result Value Ref Range   TSH 3.810 0.350 - 4.500 uIU/mL  Basic metabolic panel     Status: Abnormal   Collection Time: 11/22/15  5:00 AM  Result Value Ref Range   Sodium 144 135 - 145 mmol/L   Potassium 2.2 (LL) 3.5 - 5.1 mmol/L    Comment: CRITICAL RESULT CALLED TO, READ BACK BY AND VERIFIED WITH: BUENDIA,R RN 11/22/2015 0712 JORDANS    Chloride 104 101 - 111 mmol/L   CO2 29 22 - 32 mmol/L   Glucose, Bld 99 65 - 99 mg/dL   BUN <5 (L) 6 - 20 mg/dL   Creatinine, Ser 0.64 0.44 - 1.00 mg/dL   Calcium 8.1 (L) 8.9 - 10.3 mg/dL   GFR calc non Af Amer >60 >60 mL/min   GFR calc Af Amer >60 >60 mL/min    Comment: (NOTE) The eGFR has been calculated using the CKD EPI equation. This calculation has not been validated in all clinical situations. eGFR's persistently <60 mL/min signify possible Chronic Kidney Disease.    Anion gap 11 5 - 15  Magnesium     Status: None   Collection Time: 11/22/15  5:00 AM  Result Value Ref Range   Magnesium 2.0 1.7 - 2.4 mg/dL  Phosphorus     Status: None   Collection Time: 11/22/15  5:00 AM  Result Value Ref Range   Phosphorus 3.3 2.5 - 4.6 mg/dL  CBC     Status: Abnormal   Collection Time: 11/22/15  5:00 AM    Result Value Ref Range   WBC 3.4 (L) 4.0 - 10.5 K/uL   RBC 2.99 (L) 3.87 - 5.11 MIL/uL   Hemoglobin 9.4 (L) 12.0 - 15.0 g/dL   HCT 29.6 (L) 36.0 - 46.0 %   MCV 99.0 78.0 - 100.0 fL   MCH 31.4 26.0 - 34.0 pg   MCHC 31.8 30.0 - 36.0 g/dL   RDW 15.7 (H) 11.5 - 15.5 %   Platelets 132 (L) 150 - 400 K/uL    Mr Jeri Cos Wo Contrast  11/20/2015  CLINICAL DATA:  Loss of consciousness. Mental status changes. Alcohol withdrawal. Abnormal CT scan. EXAM: MRI HEAD WITHOUT AND WITH CONTRAST TECHNIQUE: Multiplanar, multiecho pulse sequences of the brain and surrounding structures were obtained without  and with intravenous contrast. CONTRAST:  21m MULTIHANCE GADOBENATE DIMEGLUMINE 529 MG/ML IV SOLN COMPARISON:  CT head without contrast 11/18/2015 FINDINGS: Extensive posterior parietal and occipital subcortical T2 signal changes are present bilaterally. There is a punctate focus of restricted diffusion in the left parietal lobe on image 42 of series 4. At the focal area of cortical restricted diffusion measures 8 mm on image 23 of series 4 in the left occipital lobe. No other restricted diffusion is present. There is no associated hemorrhage. Minimal scattered subcortical white matter disease is present in the frontal lobes bilaterally as well, worse on the right. The ventricles are of normal size. The internal auditory canals are normal bilaterally. The brainstem and cerebellum are unremarkable. The globes and orbits are intact. The paranasal sinuses are clear. Fluid is present in the mastoid air cells bilaterally. No obstructing nasopharyngeal lesion is evident. The postcontrast images demonstrate no pathologic enhancement. Dedicated imaging of the temporal lobes demonstrate symmetric size and signal of the hippocampal structures. IMPRESSION: 1. Posterior reversible encephalopathy syndrome. 2. Two focal areas are restricted diffusion in the left parietal and occipital lobe may represent non reversible areas of  infarction. 3. No associated hemorrhage. 4. Minimal subcortical white matter disease otherwise. Electronically Signed   By: CSan MorelleM.D.   On: 11/20/2015 18:37    Review of Systems  Constitutional: Negative for fever and diaphoresis.  HENT: Negative for sore throat.   Respiratory: Negative for shortness of breath.   Cardiovascular: Negative for chest pain, palpitations, orthopnea, leg swelling and PND.  Gastrointestinal: Negative for nausea, vomiting, abdominal pain, blood in stool and melena.  Genitourinary: Negative for dysuria.  Musculoskeletal: Negative for myalgias.  Neurological: Negative for dizziness and weakness.  All other systems reviewed and are negative.  Blood pressure 140/82, pulse 90, temperature 99.4 F (37.4 C), temperature source Oral, resp. rate 19, height _0  (1.6 m), weight 183 lb 14.4 oz (83.416 kg), SpO2 100 %. Physical Exam  Nursing note and vitals reviewed. Constitutional: She appears well-developed. No distress.  Obese  HEENT: Pupils are equal round react to light accommodation extraocular movements are intact.  Neck: no JVDNo cervical lymphadenopathy. Cardiac: Regular rate and rhythm without murmurs rubs or gallops. Lungs:  clear to auscultation bilaterally, no wheezing, rhonchi or rales Abd: soft, nontender, positive bowel sounds all quadrants, no hepatosplenomegaly Ext: no lower extremity edema.  2+ radial and dorsalis pedis pulses. Skin: warm and dry Neuro:  Grossly normal   Assessment/Plan: Principal Problem:   Delirium tremens ([place] Active Problems:   Alcohol withdrawal seizure ([place]   Acute encephalopathy   Encounter for central line placement   CVA (cerebral infarction)   Respiratory failure ([place]   PRES (posterior reversible encephalopathy syndrome)   Alcohol abuse  55year old obese female with a history of alcohol abuse since age 55 hypertension, depression, cholelithiasis and hyperlipidemia.  She presented with  withdrawal seizures.  PRES.  She had an echocardiogram this admission revealed ejection fraction of 45-50% with akinesis of the basal inferior and inferior septal myocardium. Mild MR.  EKG shows normal sinus rhythm nonspecific T-wave changes.  Troponin elevated with a peak of 6.07 on May 17.  She was gastric occult blood positive.    MRI brain yesterday:  posterior reversible encephalopathy with a couple of small areas of restricted diffusion in the left parietal and occipital lobes.     Given her family history of CAD, alcohol abuse and tobacco abuse as well as troponin elevation is 6, subtle EKG  changes and wall motion abnormality on echocardiogram, it would be ideal to conduct a left heart catheterization to identify any coronary disease and treat. Given that she just had contrast today for CT angiogram recommend at least holding off until Tuesday and waiting to be cleared by neurology .  She denies CP.  She was also gastric occult positive prior when intubated. If she indeed has coronary disease and need stents she'll need to be on antiplatelets medicine.  Does she need GI consult?   Tarri Fuller, [facility] 11/22/2015, 3:33 PM     Personally seen and examined. Agree with above.  55 year old female heavy alcohol use who came in with delirium tremens, PRES, was intubated, and had troponin of 6, with echocardiogram demonstrating wall motion abnormality in the inferoseptal/basal inferior region with ejection fraction of 45-50%.  There was some question about potential small stroke on her MRI. I reviewed neurology note and they think that this is likely sequela from her PRES.  Given her smoking history, age, mother with CAD, troponin of 6, wall motion abnormality, reduced ejection fraction, I think it would be reasonable to proceed with diagnostic coronary angiography to provide a definitive diagnosis and skip the intermediary step of nuclear stress test for further risk stratification. Risks and benefits of  the procedure were discussed including stroke, heart attack, death, renal impairment. Right radial artery approach. They're willing to proceed. I think that Tuesday will make sense given her hypokalemia. This will also allow [place] to make sure that her neurologic status remains stable. Neurologically at this point, she is intact showing no evidence of stroke. Lengthy discussion with her and her husband took place.  Regular rate and rhythm, alert and oriented 3, no focal abnormalities, 2+ radial pulse.  I discussed findings with Dr. Rockne Menghini, consider utilization of central line for potassium repletion.  Candee Furbish, MD

## 2015-11-22 NOTE — Progress Notes (Signed)
Patient very tearful about bed alarm and feeling that she has no independence, we communicated very clearly that I Could leave it off if she agreed and family agreed not to get the patient up, Called to room shortly after by enviromental patient [facility], bed alarm reset to bed.

## 2015-11-22 NOTE — Evaluation (Signed)
Physical Therapy Evaluation Patient Details Name: Abigail HutchingSusan Hull MRN: [id number] DOB: [birth year] Today's Date: 11/22/2015   History of Present Illness  55 year old female past medical history significant for alcohol abuse and hypertension. She typically drinks about 1 pint of hard alcohol daily, and has not had a drink for about 3 days now. She was last seen normal 5/16 at around noon. Since that time she has had significant nausea vomiting, blurry vision, difficulty ambulating, and apparent hallucinations. Her mental status continued to worsen since that time, until her husband witnessed her having a seizure-like episode and called EMS. [facility] she was unresponsive and emergently intubated for airway protection. CT of the head showed bilateral areas of bilateral parietal occipital foci of poorly defined hypoattenuation. Ammonia level was also elevated. EEG done 5/17 and was abnormal in the Rt frontal lobe.    Clinical Impression  Pt admitted with above diagnosis. Pt currently with functional limitations due to the deficits listed below (see PT Problem List). [name] presents w/ ataxic gait and requires assist to steady w/ transfers and ambulation.  Pt's husband will be available to provide 24/7 assist/supervision at d/c and reports he is good physical health to do so. Pt will benefit from skilled PT to increase their independence and safety with mobility to allow discharge to the venue listed below.      Follow Up Recommendations Home health PT;Supervision for mobility/OOB    Equipment Recommendations  Rolling walker with 5" wheels    Recommendations for Other Services       Precautions / Restrictions Precautions Precautions: Fall Restrictions Weight Bearing Restrictions: No      Mobility  Bed Mobility Overal bed mobility: Needs Assistance Bed Mobility: Supine to Sit;Sit to Supine     Supine to sit: Min guard;HOB elevated Sit to supine:  Supervision   General bed mobility comments: Multiple reminders to wait until lines organized before trying to get OOB as pt getting tangled moving in bed.    Transfers Overall transfer level: Needs assistance Equipment used: None Transfers: Sit to/from Stand Sit to Stand: Min assist         General transfer comment: Assist to steady due to posterior LOB x1 when standing.  Pt denies dizziness.  Ambulation/Gait Ambulation/Gait assistance: Min assist Ambulation Distance (Feet): 200 Feet Assistive device: None Gait Pattern/deviations: Decreased stride length;Ataxic;Staggering left;Staggering right;Narrow base of support   Gait velocity interpretation: Below normal speed for age/gender General Gait Details: Pt staggering Lt/Rt and requires assist to steady. Pt denies dizziness.  HR up to 123.  Stairs            Wheelchair Mobility    Modified Rankin (Stroke Patients Only)       Balance Overall balance assessment: Needs assistance Sitting-balance support: Bilateral upper extremity supported;Feet supported Sitting balance-Leahy Scale: Fair Sitting balance - Comments: Close min guard assist for safety   Standing balance support: Bilateral upper extremity supported;During functional activity Standing balance-Leahy Scale: Poor Standing balance comment: Physical assist for support                              Pertinent Vitals/Pain Pain Assessment: No/denies pain    Home Living Family/patient expects to be discharged to:: Private residence Living Arrangements: Spouse/significant other Available Help at Discharge: Family;Available 24 hours/day Type of Home: House Home Access: Stairs to enter Entrance Stairs-Rails: LawyerLeft;Right Entrance Stairs-Number of Steps: 3 Home Layout: One level Home Equipment:  None      Prior Function Level of Independence: Independent         Comments: 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      Hand Dominance        Extremity/Trunk Assessment   Upper Extremity Assessment: Defer to OT evaluation           Lower Extremity Assessment: RLE deficits/detail;LLE deficits/detail         Communication   Communication: No difficulties  Cognition Arousal/Alertness: Awake/alert Behavior During Therapy: Restless Overall Cognitive Status: Impaired/Different from baseline Area of Impairment: Attention;Memory;Safety/judgement   Current Attention Level: Selective Memory: Decreased short-term memory   Safety/Judgement: Decreased awareness of safety     General Comments: Pt easily distracted by lines and continues to pull on them despite multiple reminders not to touch them.  Per chart review she was getting OOB w/o staff.    General Comments      Exercises Other Exercises Other Exercises: Encouraged pt to ambulate in hallway 3x/day w/ nursing staff      Assessment/Plan    PT Assessment Patient needs continued PT services  PT Diagnosis Difficulty walking;Abnormality of gait   PT Problem List Decreased balance;Decreased coordination;Decreased safety awareness;Decreased knowledge of use of DME  PT Treatment Interventions DME instruction;Gait training;Stair training;Functional mobility training;Therapeutic activities;Balance training;Therapeutic exercise;Cognitive remediation;Patient/family education   PT Goals (Current goals can be found in the Care Plan section) Acute Rehab PT Goals Patient Stated Goal: to go home PT Goal Formulation: With patient/family Time For Goal Achievement: 12/06/15 Potential to Achieve Goals: Good    Frequency Min 3X/week   Barriers to discharge Inaccessible home environment Steps to enter home    Co-evaluation               End of Session Equipment Utilized During Treatment: Gait belt Activity Tolerance: Patient tolerated treatment well Patient left: in bed;with call bell/phone within reach;with bed alarm set Nurse  Communication: Mobility status;Other (comment) (HR)         Time: 1023-1040 PT Time Calculation (min) (ACUTE ONLY): 17 min   Charges:   PT Evaluation $PT Eval Low Complexity: 1 Procedure     PT G Codes:       Encarnacion Chu PT, DPT  Pager: [phone] Phone: [phone] 11/22/2015, 1:30 PM

## 2015-11-23 DIAGNOSIS — I248 Other forms of acute ischemic heart disease: Secondary | ICD-10-CM

## 2015-11-23 HISTORY — DX: Other forms of acute ischemic heart disease: I24.8

## 2015-11-23 LAB — CBC
HEMATOCRIT: 32 % — AB (ref 36.0–46.0)
HEMOGLOBIN: 10 g/dL — AB (ref 12.0–15.0)
MCH: 32.6 pg (ref 26.0–34.0)
MCHC: 31.3 g/dL (ref 30.0–36.0)
MCV: 104.2 fL — AB (ref 78.0–100.0)
Platelets: 145 10*3/uL — ABNORMAL LOW (ref 150–400)
RBC: 3.07 MIL/uL — ABNORMAL LOW (ref 3.87–5.11)
RDW: 16.1 % — AB (ref 11.5–15.5)
WBC: 4 10*3/uL (ref 4.0–10.5)

## 2015-11-23 LAB — BASIC METABOLIC PANEL
Anion gap: 8 (ref 5–15)
BUN: <5 mg/dL — ABNORMAL LOW (ref 6–20)
CALCIUM: 8.5 mg/dL — AB (ref 8.9–10.3)
CO2: 25 mmol/L (ref 22–32)
CREATININE: 0.7 mg/dL (ref 0.44–1.00)
Chloride: 112 mmol/L — ABNORMAL HIGH (ref 101–111)
GFR calc Af Amer: >60 mL/min (ref >60)
GFR calc non Af Amer: >60 mL/min (ref >60)
GLUCOSE: 99 mg/dL (ref 65–99)
Potassium: 3.2 mmol/L — ABNORMAL LOW (ref 3.5–5.1)
Sodium: 145 mmol/L (ref 135–145)

## 2015-11-23 LAB — MAGNESIUM: Magnesium: 2 mg/dL (ref 1.7–2.4)

## 2015-11-23 LAB — GLUCOSE, CAPILLARY
Glucose-Capillary: 99 mg/dL (ref 65–99)
Glucose-Capillary: 95 mg/dL (ref 65–99)

## 2015-11-23 LAB — HEMOGLOBIN A1C
HEMOGLOBIN A1C: 5.1 % (ref 4.8–5.6)
MEAN PLASMA GLUCOSE: 100 mg/dL

## 2015-11-23 LAB — CULTURE, BLOOD (ROUTINE X 2): Culture: NO GROWTH

## 2015-11-23 LAB — PHOSPHORUS: Phosphorus: 3 mg/dL (ref 2.5–4.6)

## 2015-11-23 MED ORDER — POTASSIUM CHLORIDE CRYS ER 20 MEQ PO TBCR
40.0000 meq | EXTENDED_RELEASE_TABLET | Freq: Three times a day (TID) | ORAL | Status: AC
  Administered 2015-11-23 (×3): 40 meq via ORAL
  Filled 2015-11-23 (×3): qty 2

## 2015-11-23 MED ORDER — SODIUM CHLORIDE 0.9% FLUSH: 3.0000 mL | INTRAVENOUS | Status: DC | PRN

## 2015-11-23 MED ORDER — SODIUM CHLORIDE 0.9% FLUSH
10.0000 mL | Freq: Two times a day (BID) | INTRAVENOUS | Status: DC
  Administered 2015-11-24: 10 mL

## 2015-11-23 MED ORDER — SODIUM CHLORIDE 0.9 % WEIGHT BASED INFUSION
3.0000 mL/kg/h | INTRAVENOUS | Status: DC
  Administered 2015-11-24: 3 mL/kg/h via INTRAVENOUS

## 2015-11-23 MED ORDER — PANTOPRAZOLE SODIUM 40 MG PO TBEC
40.0000 mg | DELAYED_RELEASE_TABLET | Freq: Two times a day (BID) | ORAL | Status: DC
  Administered 2015-11-23 – 2015-11-24 (×3): 40 mg via ORAL
  Filled 2015-11-23 (×3): qty 1

## 2015-11-23 MED ORDER — SODIUM CHLORIDE 0.9% FLUSH: 10.0000 mL | INTRAVENOUS | Status: DC | PRN

## 2015-11-23 MED ORDER — SODIUM CHLORIDE 0.9 % WEIGHT BASED INFUSION: 1.0000 mL/kg/h | INTRAVENOUS | Status: DC

## 2015-11-23 MED ORDER — LEVETIRACETAM 500 MG PO TABS
1000.0000 mg | ORAL_TABLET | Freq: Two times a day (BID) | ORAL | Status: DC
  Administered 2015-11-23 – 2015-11-24 (×4): 1000 mg via ORAL
  Filled 2015-11-23 (×4): qty 2

## 2015-11-23 MED ORDER — SODIUM CHLORIDE 0.9% FLUSH
3.0000 mL | Freq: Two times a day (BID) | INTRAVENOUS | Status: DC
  Administered 2015-11-24: 3 mL via INTRAVENOUS

## 2015-11-23 MED ORDER — SODIUM CHLORIDE 0.9 % IV SOLN
250.0000 mL | INTRAVENOUS | Status: DC | PRN
Start: 2015-11-23 — End: 2015-11-24

## 2015-11-23 NOTE — Progress Notes (Signed)
Speech Language Pathology Treatment: Dysphagia  Patient Details Name: Abigail Hull MRN: [id number] DOB: 10/30/1960 Today's Date: 11/23/2015 Time: 9622-2979 SLP Time Calculation (min) (ACUTE ONLY): 10 min  Assessment / Plan / Recommendation Clinical Impression  F/u after initial swallow evaluation s/p extubation.  Pt with adequate mastication, brisk swallow response, and no overt s/s of aspiration despite large, successive boluses.  Lungs are CTA.  No dysphagia.  Recommend continuing current diet; SLP services to sign off.    HPI HPI: 55 year old female past medical history significant for alcohol abuse and hypertension. 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 She typically drinks about 1 pint of hard alcohol daily, and has not had a drink for about 3 days now. She was last seen normal 5/16 at around noon. Since that time she has had significant nausea vomiting, blurry vision, difficulty ambulating, and apparent hallucinations. Her mental status continued to worsen since that time, until her husband witnessed her having a seizure-like episode and called EMS. Upon arrival to [facility] emergency department she was unresponsive and emergently intubated for airway protection. CT of the head showed bilateral areas of bilateral parietal occipital foci of poorly defined hypoattenuation. Ammonia level was also elevated. She was transferred to [facility] for ICU admission.      SLP Plan  All goals met     Recommendations  Diet recommendations: Regular;Thin liquid Medication Administration: Whole meds with liquid             Oral Care Recommendations: Oral care BID Plan: All goals met     GO                Juan Quam Laurice 11/23/2015, 2:41 PM

## 2015-11-23 NOTE — Progress Notes (Signed)
Pharmacy Antibiotic Note  Abigail Hull is a 55 y.o. female admitted on 11/18/2015 with PNA.  Pharmacy has been consulted for vancomycin dosing.  Day #6 of abx for aspiration PNA. Tmax of 99.8, WBC wnl. SCr stable, CrCl ~6080ml/min. Timing of vanc dose off today d/t line access. IV team up here now to try to get another line. Consider vanc trough when back on schedule  Plan: Continue Unasyn per MD Continue vancomycin 1g IV Q12 Monitor clinical picture, renal function, VT prn F/U C&S, abx deescalation / LOT   Height: 5\' 3"  (160 cm) Weight: 186 lb 3.2 oz (84.46 kg) IBW/kg (Calculated) : 52.4  Temp (24hrs), Avg:99.5 F (37.5 C), Min:99.4 F (37.4 C), Max:99.8 F (37.7 C)   Recent Labs Lab 11/18/15 0643 11/19/15 0545 11/19/15 1050 11/20/15 0610 11/21/15 0600 11/22/15 0500 11/23/15 0419  WBC 5.3  --  4.9 6.7 5.3 3.4* 4.0  CREATININE 0.69 0.77  --  0.61 0.70 0.64 0.70  LATICACIDVEN 0.8  --   --   --   --   --   --     Estimated Creatinine Clearance: 83.7 mL/min (by C-G formula based on Cr of 0.7).    No Known Allergies  5/17 Unasyn >> 5/19 Vancomycin >>   Thank you for allowing us to participate in this patients care. Signe Coltonya C Ashanty Coltrane, PharmD Pager: [phone]  11/23/2015 3:05 PM

## 2015-11-23 NOTE — Progress Notes (Signed)
Progress Note    Abigail Hull  JWJ:[id number] DOB: 03-Jul-1961  DOA: 11/18/2015 PCP: No primary care provider on file.    Brief Narrative:   Abigail Hull is an 55 y.o. female with a PMH of alcohol abuse and hypertension who was admitted by the critical care team on 11/18/15 for treatment of delirium tremens and alcohol withdrawal seizure complicated by the need for intubation to protect her airway will stop upon initial presentation, she was also found to have an elevated ammonia level and abnormal CT of the head.  Assessment/Plan:   Principal Problem:   Delirium tremens (HCC)/Acute encephalopathy/alcohol withdrawal seizure in the setting of alcohol abuse Patient was admitted and intubated for airway protection. Placed on propofol on admission. EEG done 11/18/15 and was abnormal with neuronal dysfunction and epileptogenic potential in the right frontal lobe. Placed on Ativan detox protocol post extubation. Continue supplement thiamine and folic acid. Continue Keppra. HIV, RPR Negative. No longer has any symptoms suggestive of withdrawal.  Active Problems:   Hypotension requiring pressor support Patient was placed on Neo-Synephrine early on admission due to hypotension despite IV fluid challenge. Remains hemodynamically stable.    Elevated troponin/demand ischemia/NSTEMI/acute systolic CHF Patient was placed on heparin for concerns for NSTEMI which was continued through 11/21/15.  Troponin initially 6.07, trended down subsequently. 2-D echo done 11/18/15 and showed mildly reduced systolic function with an EF of 45-50 percent and akinesis of the basal inferior and inferoseptal myocardium. Continue aspirin. Cardiology consultation performed with plans to proceed with cardiac catheterization.    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 MRI of the brain done 11/20/15 and showed 2 focal areas in the left parietal and  occipital lobe consistent with infarction as well as PRES. 2-D echo done 11/18/15 and no cardiac source of emboli found. Neurology following. Continue aspirin. Speech therapy evaluation done 11/21/15 and diet advanced. Appears to be back at her prior level of functioning with no focal neurological deficits. Further workup with CT angiogram of the head and neck ordered by neurology. Lipid panel showed a cholesterol of 186, LDL 117. Statin started. Hemoglobin A1c pending.    Respiratory failure (HCC) Intubated on admission for airway protection, subsequently extubated 11/21/15. Respiratory status remains stable.    High anion gap metabolic acidosis Vigorously hydrated. Resolved.    Possible aspiration pneumonia with MRSA positive sputum culture/MRSE bacteremia Initially placed on Unasyn. Vancomycin added 11/20/15. Repeat blood cultures done 11/21/15, negative to date.    Hypokalemia/hypophosphatemia Monitor and replace as needed. Continue oral replacement today.    Obesity Evaluated by dietitian. Tube feedings initiated 11/19/15. Diet now advanced and NG tube removed.    Pancytopenia Likely secondary to the toxic effects of alcohol on the bone marrow.   Family Communication/Anticipated D/C date and plan/Code Status   DVT prophylaxis: Heparin ordered. Code Status: Full Code.  Family Communication: Husband at the bedside. Disposition Plan: Home when work up completed, likely another 1-2 days with cardiac catheterization planned for 11/24/15.   Medical Consultants:    Neurology  Cardiology   Procedures:   EEG 11/18/15 2-D echo 11/18/15  Anti-Infectives:   Unasyn 11/18/15---> Vancomycin 11/20/15--->   Subjective:   Abigail Hull feels well with no complaints of chest pain, dyspnea, nausea/vomiting.  Objective:    Filed Vitals:   11/22/15 1652 11/22/15 2000 11/23/15 0008 11/23/15 0527  BP: 140/78 128/83 124/69 147/82  Pulse: 89 81 84 88  Temp:  99.4 F (37.4 C) 99.4 F (37.4  C) 99.5 F (37.5 C) 99.5 F (37.5 C)  TempSrc: Oral  Oral   Resp: 20 21 25 24   Height:      Weight:    84.46 kg (186 lb 3.2 oz)  SpO2: 100% 99% 98% 97%    Intake/Output Summary (Last 24 hours) at 11/23/15 0800 Last data filed at 11/23/15 0500  Gross per 24 hour  Intake    720 ml  Output      0 ml  Net    720 ml   Filed Weights   11/21/15 0400 11/22/15 0446 11/23/15 0527  Weight: 84.1 kg (185 lb 6.5 oz) 83.416 kg (183 lb 14.4 oz) 84.46 kg (186 lb 3.2 oz)    Exam: General exam: Appears calm and comfortable.  Respiratory system: Clear to auscultation. Respiratory effort normal. Cardiovascular system: S1 & S2 heard, RRR. No JVD,  rubs, gallops or clicks. No murmurs. Gastrointestinal system: Abdomen is nondistended, soft and nontender. No organomegaly or masses felt. Normal bowel sounds heard. Central nervous system: Alert and oriented. No focal neurological deficits. Extremities: No clubbing, edema, or cyanosis. Skin: No rashes, lesions or ulcers Psychiatry: Judgement and insight appear normal. Mood & affect appropriate.  Data Reviewed:   I have personally reviewed following labs and imaging studies:  Labs: Basic Metabolic Panel:  Recent Labs Lab 11/19/15 0545 11/19/15 1350 11/20/15 0610 11/21/15 0600 11/22/15 0500 11/23/15 0419  NA 145  --  146* 146* 144 145  K 3.1* 3.8 3.2* 2.7* 2.2* 3.2*  CL 110  --  111 104 104 112*  CO2 23  --  25 26 29 25   GLUCOSE 99  --  107* 88 99 99  BUN <5*  --  8 <5* <5* <5*  CREATININE 0.77  --  0.61 0.70 0.64 0.70  CALCIUM 7.5*  --  8.0* 8.4* 8.1* 8.5*  MG 2.1  --  1.8 1.7 2.0 2.0  PHOS 1.9* 2.5 2.5 2.4* 3.3 3.0   GFR Estimated Creatinine Clearance: 83.7 mL/min (by C-G formula based on Cr of 0.7). Liver Function Tests:  Recent Labs Lab 11/18/15 0643 11/20/15 0610  AST 182* 74*  ALT 51 35  ALKPHOS 66 71  BILITOT 1.4* 0.8  PROT 5.1* 5.1*  ALBUMIN 3.0* 2.5*    Recent Labs Lab 11/18/15 0643  LIPASE 24  AMYLASE 64     Recent Labs Lab 11/18/15 0704  AMMONIA 27   Coagulation profile  Recent Labs Lab 11/18/15 0643  INR 1.09    CBC:  Recent Labs Lab 11/18/15 0643 11/19/15 1050 11/19/15 1700 11/20/15 0610 11/21/15 0600 11/22/15 0500 11/23/15 0419  WBC 5.3 4.9  --  6.7 5.3 3.4* 4.0  NEUTROABS 4.4  --   --   --   --   --   --   HGB 11.0* 9.5* 9.9* 9.8* 9.9* 9.4* 10.0*  HCT 32.3* 29.4* 30.9* 31.4* 31.7* 29.6* 32.0*  MCV 98.2 99.3  --  101.3* 100.3* 99.0 104.2*  PLT 64* 68*  --  94* 122* 132* 145*   Cardiac Enzymes:  Recent Labs Lab 11/18/15 0643 11/18/15 1200 11/18/15 1839  CKTOTAL 268* 252*  --   CKMB  --  27.0*  --   TROPONINI 6.07* 3.26* 1.90*   BNP (last 3 results) No results for input(s): PROBNP in the last 8760 hours. CBG:  Recent Labs Lab 11/21/15 0449 11/21/15 0804 11/21/15 1118 11/21/15 1555 11/22/15 0126  GLUCAP 112* 88 131* 128*  118*   D-Dimer: No results for input(s): DDIMER in the last 72 hours. Hgb A1c: No results for input(s): HGBA1C in the last 72 hours. Lipid Profile:  Recent Labs  11/21/15 0600 11/22/15 0140  CHOL  --  186  HDL  --  36*  LDLCALC  --  117*  TRIG 298* 166*  CHOLHDL  --  5.2   Thyroid function studies:  Recent Labs  11/22/15 0242  TSH 3.810   Anemia work up:  Recent Labs  11/22/15 0140  VITAMINB12 1008*   Sepsis Labs:  Recent Labs Lab 11/18/15 0643 11/18/15 1200 11/19/15 0545  11/20/15 0610 11/21/15 0600 11/22/15 0500 11/23/15 0419  PROCALCITON  --  0.27 0.25  --  0.17  --   --   --   WBC 5.3  --   --   < > 6.7 5.3 3.4* 4.0  LATICACIDVEN 0.8  --   --   --   --   --   --   --   < > = values in this interval not displayed. Urine analysis: No results found for: COLORURINE, APPEARANCEUR, LABSPEC, PHURINE, GLUCOSEU, HGBUR, BILIRUBINUR, KETONESUR, PROTEINUR, UROBILINOGEN, NITRITE, LEUKOCYTESUR Microbiology Recent Results (from the past 240 hour(s))  MRSA PCR Screening     Status: Abnormal   Collection  Time: 11/18/15  5:25 AM  Result Value Ref Range Status   MRSA by PCR POSITIVE (A) NEGATIVE Final    Comment:        The GeneXpert MRSA Assay (FDA approved for NASAL specimens only), is one component of a comprehensive MRSA colonization surveillance program. It is not intended to diagnose MRSA infection nor to guide or monitor treatment for MRSA infections. RESULT CALLED TO, READ BACK BY AND VERIFIED WITH: Vernie Shanks RN 9:35 11/18/15 ([email or web address])   Culture, respiratory (NON-Expectorated)     Status: Abnormal   Collection Time: 11/18/15 10:50 AM  Result Value Ref Range Status   Specimen Description TRACHEAL ASPIRATE  Final   Special Requests Normal  Final   Gram Stain   Final    ABUNDANT WBC PRESENT,BOTH PMN AND MONONUCLEAR NO SQUAMOUS EPITHELIAL CELLS SEEN RARE GRAM POSITIVE COCCI IN PAIRS Performed at [facility]    Culture (A)  Final    METHICILLIN RESISTANT STAPHYLOCOCCUS AUREUS Note: RIFAMPIN AND GENTAMICIN SHOULD NOT BE USED AS SINGLE DRUGS FOR TREATMENT OF STAPH INFECTIONS. This organism DOES NOT demonstrate inducible Clindamycin resistance in vitro. Performed at [facility]    Report Status 11/22/2015 FINAL  Final   Organism ID, Bacteria METHICILLIN RESISTANT STAPHYLOCOCCUS AUREUS  Final      Susceptibility   Methicillin resistant staphylococcus aureus - MIC*    CLINDAMYCIN <=0.25 SENSITIVE Sensitive     ERYTHROMYCIN >=8 RESISTANT Resistant     GENTAMICIN <=0.5 SENSITIVE Sensitive     LEVOFLOXACIN 4 INTERMEDIATE Intermediate     OXACILLIN >=4 RESISTANT Resistant     RIFAMPIN <=0.5 SENSITIVE Sensitive     TRIMETH/SULFA <=10 SENSITIVE Sensitive     VANCOMYCIN 1 SENSITIVE Sensitive     TETRACYCLINE <=1 SENSITIVE Sensitive     * METHICILLIN RESISTANT STAPHYLOCOCCUS AUREUS  Culture, blood (routine x 2)     Status: Abnormal   Collection Time: 11/18/15 12:00 PM  Result Value Ref Range Status   Specimen Description BLOOD RIGHT ARM  Final   Special  Requests BOTTLES DRAWN AEROBIC AND ANAEROBIC 5CC  Final   Culture  Setup Time   Final    GRAM POSITIVE COCCI  IN CLUSTERS AEROBIC BOTTLE ONLY Organism ID to follow CRITICAL RESULT CALLED TO, READ BACK BY AND VERIFIED WITH: E. MARTIN, PHARM D AT 1130 ON [id number] BY M. WILSON    Culture (A)  Final    STAPHYLOCOCCUS SPECIES (COAGULASE NEGATIVE) THE SIGNIFICANCE OF ISOLATING THIS ORGANISM FROM A SINGLE SET OF BLOOD CULTURES WHEN MULTIPLE SETS ARE DRAWN IS UNCERTAIN. PLEASE NOTIFY THE MICROBIOLOGY DEPARTMENT WITHIN ONE WEEK IF SPECIATION AND SENSITIVITIES ARE REQUIRED.    Report Status 11/21/2015 FINAL  Final  Blood Culture ID Panel (Reflexed)     Status: Abnormal   Collection Time: 11/18/15 12:00 PM  Result Value Ref Range Status   Enterococcus species NOT DETECTED NOT DETECTED Final   Vancomycin resistance NOT DETECTED NOT DETECTED Final   Listeria monocytogenes NOT DETECTED NOT DETECTED Final   Staphylococcus species DETECTED (A) NOT DETECTED Final    Comment: CRITICAL RESULT CALLED TO, READ BACK BY AND VERIFIED WITH: Veronda Prude.D. 11:30 11/19/15 ([email or web address])    Staphylococcus aureus NOT DETECTED NOT DETECTED Final   Methicillin resistance DETECTED (A) NOT DETECTED Final    Comment: CRITICAL RESULT CALLED TO, READ BACK BY AND VERIFIED WITH: EBrain Hilts.D. 11:30 11/19/15 ([email or web address])    Streptococcus species NOT DETECTED NOT DETECTED Final   Streptococcus agalactiae NOT DETECTED NOT DETECTED Final   Streptococcus pneumoniae NOT DETECTED NOT DETECTED Final   Streptococcus pyogenes NOT DETECTED NOT DETECTED Final   Acinetobacter baumannii NOT DETECTED NOT DETECTED Final   Enterobacteriaceae species NOT DETECTED NOT DETECTED Final   Enterobacter cloacae complex NOT DETECTED NOT DETECTED Final   Escherichia coli NOT DETECTED NOT DETECTED Final   Klebsiella oxytoca NOT DETECTED NOT DETECTED Final   Klebsiella pneumoniae NOT DETECTED NOT DETECTED Final   Proteus species NOT DETECTED NOT  DETECTED Final   Serratia marcescens NOT DETECTED NOT DETECTED Final   Carbapenem resistance NOT DETECTED NOT DETECTED Final   Haemophilus influenzae NOT DETECTED NOT DETECTED Final   Neisseria meningitidis NOT DETECTED NOT DETECTED Final   Pseudomonas aeruginosa NOT DETECTED NOT DETECTED Final   Candida albicans NOT DETECTED NOT DETECTED Final   Candida glabrata NOT DETECTED NOT DETECTED Final   Candida krusei NOT DETECTED NOT DETECTED Final   Candida parapsilosis NOT DETECTED NOT DETECTED Final   Candida tropicalis NOT DETECTED NOT DETECTED Final  Culture, blood (routine x 2)     Status: None (Preliminary result)   Collection Time: 11/18/15 12:09 PM  Result Value Ref Range Status   Specimen Description BLOOD RIGHT HAND  Final   Special Requests BOTTLES DRAWN AEROBIC ONLY 7CC  Final   Culture NO GROWTH 4 DAYS  Final   Report Status PENDING  Incomplete  C difficile quick scan w PCR reflex     Status: None   Collection Time: 11/20/15 12:48 AM  Result Value Ref Range Status   C Diff antigen NEGATIVE NEGATIVE Final   C Diff toxin NEGATIVE NEGATIVE Final   C Diff interpretation Negative for toxigenic C. difficile  Final  Culture, blood (routine x 2)     Status: None (Preliminary result)   Collection Time: 11/20/15 10:40 AM  Result Value Ref Range Status   Specimen Description BLOOD RIGHT ANTECUBITAL  Final   Special Requests IN PEDIATRIC BOTTLE 2CC  Final   Culture NO GROWTH 2 DAYS  Final   Report Status PENDING  Incomplete  Culture, blood (routine x 2)     Status: None (Preliminary result)   Collection Time: 11/20/15 10:46 AM  Result Value Ref Range Status   Specimen Description BLOOD LEFT ANTECUBITAL  Final   Special Requests IN PEDIATRIC BOTTLE 3CC  Final   Culture NO GROWTH 2 DAYS  Final   Report Status PENDING  Incomplete    Radiology: Ct Angio Head W/cm &/or Wo Cm  11/22/2015  CLINICAL DATA:  Stroke. Posterior reversible encephalopathy syndrome. EXAM: CT ANGIOGRAPHY HEAD  AND NECK TECHNIQUE: Multidetector CT imaging of the head and neck was performed using the standard protocol during bolus administration of intravenous contrast. Multiplanar CT image reconstructions and MIPs were obtained to evaluate the vascular anatomy. Carotid stenosis measurements (when applicable) are obtained utilizing NASCET criteria, using the distal internal carotid diameter as the denominator. CONTRAST:  50 mL Isovue 370 COMPARISON:  MRI brain 11/20/2015. CT head without contrast 11/18/2015. FINDINGS: CT HEAD Brain: Extensive white matter hypoattenuation in the parietal and occipital lobes is again noted bilaterally. The area of acute infarction at the occipital pole is better defined on today's study. No acute hemorrhage is present. No new areas of white matter hypoattenuation is evident. The basal ganglia are intact. The insular ribbon is normal. Calvarium and skull base: Within normal limits. Paranasal sinuses: Clear Orbits: Unremarkable CTA NECK Aortic arch: There is a common origin of the left common carotid artery and the innominate artery. No focal atherosclerotic calcifications or stenoses are present in the neck. A left IJ catheter is in place. Right carotid system: The right common carotid artery is normal. Calcifications are present at the right carotid bifurcation. There is no significant stenosis. The cervical right internal carotid artery is within normal limits. Left carotid system: The left common carotid artery is within normal limits. Minimal atherosclerotic changes are present at the bifurcation. There is some irregularity of the cervical left ICA at the C2 level without a significant stenosis. The left ICA is smaller than on the right. Vertebral arteries:The vertebral arteries both originate from the subclavian arteries. The left vertebral artery is the dominant vessel. There is no significant stenosis or vascular injury to either vertebral artery in the neck. Skeleton: Multilevel  endplate degenerative changes are noted from C3-4 through C6-7. Osseous foraminal narrowing is most pronounced on the right C4-5 and C5-6. No focal lytic or blastic lesions are present. Other neck: No focal mucosal or submucosal lesions are present. A right pleural effusion is present. Patchy right upper lobe airspace disease is compatible with pneumonia. The thyroid is within normal limits. There is no significant cervical adenopathy. No focal mucosal or submucosal lesions are present. The salivary glands are within normal limits. CTA HEAD Anterior circulation: In internal carotid arteries are within normal limits to the ICA terminus bilaterally. The right A1 segment is dominant. The anterior communicating artery is patent. The MCA bifurcations are intact. Moderate attenuation of distal ACA and MCA branch vessels is noted. There is no significant proximal stenosis or branch vessel occlusion. No significant aneurysm is present. Posterior circulation: The left vertebral artery is the dominant vessel. The PICA origins are visualized and normal. The basilar artery is normal. Both posterior cerebral arteries originate from the basilar tip. There is some attenuation of distal PCA branch vessels, similar to the MCA vessels. Venous sinuses: The dural sinuses are patent. The left transverse sinus is dominant. The straight sinus and deep cerebral veins are intact. The cortical veins are unremarkable. Anatomic variants: None Delayed phase: The postcontrast images accentuate the areas of posterior white matter hypoattenuation and occipital pole infarcts. No pathologic enhancement is present. IMPRESSION: 1.  Persistent subcortical white matter hypoattenuation involving the posterior parietal and occipital lobes bilaterally compatible with posterior reversible encephalopathy syndrome. 2. Evolution of remote nonhemorrhagic infarct of the left occipital pole. 3. 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 4. Atherosclerotic changes of both carotid bifurcations without significant stenosis. 5. Multilevel spondylosis of the cervical spine as described. Electronically Signed   By: Marin Robertshristopher  Mattern M.D.   On: 11/22/2015 16:07   Ct Angio Neck W/cm &/or Wo/cm  11/22/2015  CLINICAL DATA:  Stroke. Posterior reversible encephalopathy syndrome. EXAM: CT ANGIOGRAPHY HEAD AND NECK TECHNIQUE: Multidetector CT imaging of the head and neck was performed using the standard protocol during bolus administration of intravenous contrast. Multiplanar CT image reconstructions and MIPs were obtained to evaluate the vascular anatomy. Carotid stenosis measurements (when applicable) are obtained utilizing NASCET criteria, using the distal internal carotid diameter as the denominator. CONTRAST:  50 mL Isovue 370 COMPARISON:  MRI brain 11/20/2015. CT head without contrast 11/18/2015. FINDINGS: CT HEAD Brain: Extensive white matter hypoattenuation in the parietal and occipital lobes is again noted bilaterally. The area of acute infarction at the occipital pole is better defined on today's study. No acute hemorrhage is present. No new areas of white matter hypoattenuation is evident. The basal ganglia are intact. The insular ribbon is normal. Calvarium and skull base: Within normal limits. Paranasal sinuses: Clear Orbits: Unremarkable CTA NECK Aortic arch: There is a common origin of the left common carotid artery and the innominate artery. No focal atherosclerotic calcifications or stenoses are present in the neck. A left IJ catheter is in place. Right carotid system: The right common carotid artery is normal. Calcifications are present at the right carotid bifurcation. There is no significant stenosis. The cervical right internal carotid artery is within normal limits. Left carotid system: The left common carotid artery is within normal limits. Minimal  atherosclerotic changes are present at the bifurcation. There is some irregularity of the cervical left ICA at the C2 level without a significant stenosis. The left ICA is smaller than on the right. Vertebral arteries:The vertebral arteries both originate from the subclavian arteries. The left vertebral artery is the dominant vessel. There is no significant stenosis or vascular injury to either vertebral artery in the neck. Skeleton: Multilevel endplate degenerative changes are noted from C3-4 through C6-7. Osseous foraminal narrowing is most pronounced on the right C4-5 and C5-6. No focal lytic or blastic lesions are present. Other neck: No focal mucosal or submucosal lesions are present. A right pleural effusion is present. Patchy right upper lobe airspace disease is compatible with pneumonia. The thyroid is within normal limits. There is no significant cervical adenopathy. No focal mucosal or submucosal lesions are present. The salivary glands are within normal limits. CTA HEAD Anterior circulation: In internal carotid arteries are within normal limits to the ICA terminus bilaterally. The right A1 segment is dominant. The anterior communicating artery is patent. The MCA bifurcations are intact. Moderate attenuation of distal ACA and MCA branch vessels is noted. There is no significant proximal stenosis or branch vessel occlusion. No significant aneurysm is present. Posterior circulation: The left vertebral artery is the dominant vessel. The PICA origins are visualized and normal. The basilar artery is normal. Both posterior cerebral arteries originate from the basilar tip. There is some attenuation of distal PCA branch vessels, similar to the MCA vessels. Venous sinuses: The dural sinuses are patent. The left transverse sinus is dominant.  The straight sinus and deep cerebral veins are intact. The cortical veins are unremarkable. Anatomic variants: None Delayed phase: The postcontrast images accentuate the areas  of posterior white matter hypoattenuation and occipital pole infarcts. No pathologic enhancement is present. IMPRESSION: 1. Persistent subcortical white matter hypoattenuation involving the posterior parietal and occipital lobes bilaterally compatible with posterior reversible encephalopathy syndrome. 2. Evolution of remote nonhemorrhagic infarct of the left occipital pole. 3. 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 4. Atherosclerotic changes of both carotid bifurcations without significant stenosis. 5. Multilevel spondylosis of the cervical spine as described. Electronically Signed   By: Marin Roberts M.D.   On: 11/22/2015 16:07    Medications:   . ampicillin-sulbactam (UNASYN) IV  3 g Intravenous Q8H  . antiseptic oral rinse  7 mL Mouth Rinse BID  . aspirin EC  81 mg Oral Daily  . Chlorhexidine Gluconate Cloth  6 each Topical Q0600  . feeding supplement (PRO-STAT SUGAR FREE 64)  60 mL Per Tube TID  . folic acid  1 mg Oral Daily  . heparin subcutaneous  5,000 Units Subcutaneous Q8H  . levETIRAcetam  1,000 mg Intravenous Q12H  . multivitamin with minerals  1 tablet Oral Daily  . mupirocin ointment  1 application Nasal BID  . pantoprazole (PROTONIX) IV  40 mg Intravenous Q12H  . simvastatin  20 mg Oral q1800  . thiamine  100 mg Oral Daily   Or  . thiamine  100 mg Intravenous Daily  . vancomycin  1,000 mg Intravenous Q12H   Continuous Infusions:    Time spent: 25 minutes.    LOS: 5 days   RAMA,CHRISTINA  [facility] Pager [phone]. If unable to reach me by pager, please call my cell phone at [phone].  *Please refer to [email or web address], password [email or web address] to get updated schedule on who will round on this patient, as hospitalists switch teams weekly. If 7PM-7AM, please contact night-coverage at [email or web address], password [email or web address] for any overnight needs.  11/23/2015, 8:00 AM

## 2015-11-23 NOTE — Progress Notes (Signed)
Subjective:  Admitted with DTs and Sz activity. NSTEMI  Objective:  Temp:  [99.4 F (37.4 C)-99.8 F (37.7 C)] 99.8 F (37.7 C) (05/22 0800) Pulse Rate:  [81-89] 88 (05/22 0527) Resp:  [20-25] 24 (05/22 0527) BP: (124-147)/(69-83) 140/78 mmHg (05/22 0800) SpO2:  [97 %-100 %] 97 % (05/22 0800) Weight:  [186 lb 3.2 oz (84.46 kg)] 186 lb 3.2 oz (84.46 kg) (05/22 0527) Weight change: 2 lb 4.8 oz (1.043 kg)  Intake/Output from previous day: 05/21 0701 - 05/22 0700 In: 720 [P.O.:720] Out: 0   Intake/Output from this shift: Total I/O In: 240 [P.O.:240] Out: -   Physical Exam: General appearance: alert and no distress Neck: no adenopathy, no carotid bruit, no JVD, supple, symmetrical, trachea midline and thyroid not enlarged, symmetric, no tenderness/mass/nodules Lungs: clear to auscultation bilaterally Heart: regular rate and rhythm, S1, S2 normal, no murmur, click, rub or gallop Extremities: extremities normal, atraumatic, no cyanosis or edema  Lab Results: Results for orders placed or performed during the hospital encounter of 11/18/15 (from the past 48 hour(s))  Glucose, capillary     Status: Abnormal   Collection Time: 11/21/15  3:55 PM  Result Value Ref Range   Glucose-Capillary 128 (H) 65 - 99 mg/dL  Glucose, capillary     Status: Abnormal   Collection Time: 11/22/15  1:26 AM  Result Value Ref Range   Glucose-Capillary 118 (H) 65 - 99 mg/dL  Lipid panel     Status: Abnormal   Collection Time: 11/22/15  1:40 AM  Result Value Ref Range   Cholesterol 186 0 - 200 mg/dL   Triglycerides 166 (H) <150 mg/dL   HDL 36 (L) >40 mg/dL   Total CHOL/HDL Ratio 5.2 RATIO   VLDL 33 0 - 40 mg/dL   LDL Cholesterol 117 (H) 0 - 99 mg/dL    Comment:        Total Cholesterol/HDL:CHD Risk Coronary Heart Disease Risk Table                     Men   Women  1/2 Average Risk   3.4   3.3  Average Risk       5.0   4.4  2 X Average Risk   9.6   7.1  3 X Average Risk  23.4   11.0      Use the calculated Patient Ratio above and the CHD Risk Table to determine the patient's CHD Risk.        ATP III CLASSIFICATION (LDL):  <100     mg/dL   Optimal  100-129  mg/dL   Near or Above                    Optimal  130-159  mg/dL   Borderline  160-189  mg/dL   High  >190     mg/dL   Very High   Vitamin B12     Status: Abnormal   Collection Time: 11/22/15  1:40 AM  Result Value Ref Range   Vitamin B-12 1008 (H) 180 - 914 pg/mL    Comment: (NOTE) This assay is not validated for testing neonatal or myeloproliferative syndrome specimens for Vitamin B12 levels.   RPR     Status: None   Collection Time: 11/22/15  1:40 AM  Result Value Ref Range   RPR Ser Ql Non Reactive Non Reactive    Comment: (NOTE) Performed At: [facility] [place],  [place] [id number] Lindon Romp MD [id number]   HIV antibody     Status: None   Collection Time: 11/22/15  1:40 AM  Result Value Ref Range   HIV Screen 4th Generation wRfx Non Reactive Non Reactive    Comment: (NOTE) Performed At: [facility] [place], [place] [id number] Lindon Romp MD [id number]   Hemoglobin A1c     Status: None   Collection Time: 11/22/15  1:41 AM  Result Value Ref Range   Hgb A1c MFr Bld 5.1 4.8 - 5.6 %    Comment: (NOTE)         Pre-diabetes: 5.7 - 6.4         Diabetes: >6.4         Glycemic control for adults with diabetes: <7.0    Mean Plasma Glucose 100 mg/dL    Comment: (NOTE) Performed At: [facility] [place], [place] [id number] Lindon Romp MD [id number]   TSH     Status: None   Collection Time: 11/22/15  2:42 AM  Result Value Ref Range   TSH 3.810 0.350 - 4.500 uIU/mL  Basic metabolic panel     Status: Abnormal   Collection Time: 11/22/15  5:00 AM  Result Value Ref Range   Sodium 144 135 - 145 mmol/L   Potassium 2.2 (LL) 3.5 - 5.1 mmol/L    Comment: CRITICAL RESULT CALLED TO, READ BACK BY AND  VERIFIED WITH: BUENDIA,R RN 11/22/2015 0712 JORDANS    Chloride 104 101 - 111 mmol/L   CO2 29 22 - 32 mmol/L   Glucose, Bld 99 65 - 99 mg/dL   BUN <5 (L) 6 - 20 mg/dL   Creatinine, Ser 0.64 0.44 - 1.00 mg/dL   Calcium 8.1 (L) 8.9 - 10.3 mg/dL   GFR calc non Af Amer >60 >60 mL/min   GFR calc Af Amer >60 >60 mL/min    Comment: (NOTE) The eGFR has been calculated using the CKD EPI equation. This calculation has not been validated in all clinical situations. eGFR's persistently <60 mL/min signify possible Chronic Kidney Disease.    Anion gap 11 5 - 15  Magnesium     Status: None   Collection Time: 11/22/15  5:00 AM  Result Value Ref Range   Magnesium 2.0 1.7 - 2.4 mg/dL  Phosphorus     Status: None   Collection Time: 11/22/15  5:00 AM  Result Value Ref Range   Phosphorus 3.3 2.5 - 4.6 mg/dL  CBC     Status: Abnormal   Collection Time: 11/22/15  5:00 AM  Result Value Ref Range   WBC 3.4 (L) 4.0 - 10.5 K/uL   RBC 2.99 (L) 3.87 - 5.11 MIL/uL   Hemoglobin 9.4 (L) 12.0 - 15.0 g/dL   HCT 29.6 (L) 36.0 - 46.0 %   MCV 99.0 78.0 - 100.0 fL   MCH 31.4 26.0 - 34.0 pg   MCHC 31.8 30.0 - 36.0 g/dL   RDW 15.7 (H) 11.5 - 15.5 %   Platelets 132 (L) 150 - 400 K/uL  Basic metabolic panel     Status: Abnormal   Collection Time: 11/23/15  4:19 AM  Result Value Ref Range   Sodium 145 135 - 145 mmol/L   Potassium 3.2 (L) 3.5 - 5.1 mmol/L    Comment: DELTA CHECK NOTED   Chloride 112 (H) 101 - 111 mmol/L   CO2 25 22 - 32 mmol/L   Glucose, Bld 99 65 -  99 mg/dL   BUN <5 (L) 6 - 20 mg/dL   Creatinine, Ser 0.70 0.44 - 1.00 mg/dL   Calcium 8.5 (L) 8.9 - 10.3 mg/dL   GFR calc non Af Amer >60 >60 mL/min   GFR calc Af Amer >60 >60 mL/min    Comment: (NOTE) The eGFR has been calculated using the CKD EPI equation. This calculation has not been validated in all clinical situations. eGFR's persistently <60 mL/min signify possible Chronic Kidney Disease.    Anion gap 8 5 - 15  Magnesium     Status:  None   Collection Time: 11/23/15  4:19 AM  Result Value Ref Range   Magnesium 2.0 1.7 - 2.4 mg/dL  Phosphorus     Status: None   Collection Time: 11/23/15  4:19 AM  Result Value Ref Range   Phosphorus 3.0 2.5 - 4.6 mg/dL  CBC     Status: Abnormal   Collection Time: 11/23/15  4:19 AM  Result Value Ref Range   WBC 4.0 4.0 - 10.5 K/uL   RBC 3.07 (L) 3.87 - 5.11 MIL/uL   Hemoglobin 10.0 (L) 12.0 - 15.0 g/dL   HCT 32.0 (L) 36.0 - 46.0 %   MCV 104.2 (H) 78.0 - 100.0 fL   MCH 32.6 26.0 - 34.0 pg   MCHC 31.3 30.0 - 36.0 g/dL   RDW 16.1 (H) 11.5 - 15.5 %   Platelets 145 (L) 150 - 400 K/uL  Glucose, capillary     Status: None   Collection Time: 11/23/15  8:00 AM  Result Value Ref Range   Glucose-Capillary 99 65 - 99 mg/dL  Glucose, capillary     Status: None   Collection Time: 11/23/15 11:49 AM  Result Value Ref Range   Glucose-Capillary 95 65 - 99 mg/dL    Imaging: Imaging results have been reviewed  Assessment/Plan:   1. Principal Problem: 2.   Delirium tremens ([place]) 3. Active Problems: 4.   Alcohol withdrawal seizure ([place]) 5.   Acute encephalopathy 6.   Encounter for central line placement 7.   CVA (cerebral infarction) 8.   Respiratory failure ([place]) 9.   PRES (posterior reversible encephalopathy syndrome) 10.   Acute embolic stroke ([place]) 11.   Alcohol abuse 12.   Stroke ([place]) 13.   Time Spent Directly with Patient:  20 minutes  Length of Stay:  LOS: 5 days   Pt was admitted 5/17 with DT and Sz. Briefly intubated. W/U was remarkable for + trop  And MB c/w NSTEMI. 2D shows mild LV dysfunction with IL WMA. Exam benign. K being aggressively repleted. Will need cor angio to define anatomy which we will arrange tomorrow.  I have reviewed the risks, indications, and alternatives to cardiac catheterization, possible angioplasty, and stenting with the patient. Risks include but are not limited to bleeding, infection, vascular injury, stroke, myocardial infection, arrhythmia,  kidney injury, radiation-related injury in the case of prolonged fluoroscopy use, emergency cardiac surgery, and death. The patient understands the risks of serious complication is 1-2 in 9244 with diagnostic cardiac cath and 1-2% or less with angioplasty/stenting  Quay Burow 11/23/2015, 2:53 PM

## 2015-11-24 ENCOUNTER — Encounter (HOSPITAL_COMMUNITY)
Admission: EM | Disposition: A | Discharge status: Home / Self Care | Payer: Self-pay | Source: Other Acute Inpatient Hospital | Attending: Internal Medicine

## 2015-11-24 ENCOUNTER — Encounter (HOSPITAL_COMMUNITY): Payer: Self-pay | Admitting: Cardiology

## 2015-11-24 DIAGNOSIS — E876 Hypokalemia: Secondary | ICD-10-CM

## 2015-11-24 DIAGNOSIS — I214 Non-ST elevation (NSTEMI) myocardial infarction: Secondary | ICD-10-CM | POA: Diagnosis present

## 2015-11-24 DIAGNOSIS — I248 Other forms of acute ischemic heart disease: Secondary | ICD-10-CM

## 2015-11-24 HISTORY — PX: CARDIAC CATHETERIZATION: SHX172

## 2015-11-24 LAB — BASIC METABOLIC PANEL
ANION GAP: 7 (ref 5–15)
BUN: <5 mg/dL — ABNORMAL LOW (ref 6–20)
CALCIUM: 8.9 mg/dL (ref 8.9–10.3)
CO2: 26 mmol/L (ref 22–32)
Chloride: 109 mmol/L (ref 101–111)
Creatinine, Ser: 0.72 mg/dL (ref 0.44–1.00)
Glucose, Bld: 100 mg/dL — ABNORMAL HIGH (ref 65–99)
POTASSIUM: 3.8 mmol/L (ref 3.5–5.1)
Sodium: 142 mmol/L (ref 135–145)

## 2015-11-24 LAB — TRIGLYCERIDES: Triglycerides: 214 mg/dL — ABNORMAL HIGH (ref <150)

## 2015-11-24 SURGERY — LEFT HEART CATH AND CORONARY ANGIOGRAPHY

## 2015-11-24 MED ORDER — ASPIRIN 81 MG PO TBEC: 81.0000 mg | DELAYED_RELEASE_TABLET | Freq: Every day | ORAL | Status: AC

## 2015-11-24 MED ORDER — SODIUM CHLORIDE 0.9% FLUSH: 3.0000 mL | INTRAVENOUS | Status: DC | PRN

## 2015-11-24 MED ORDER — SODIUM CHLORIDE 0.9 % IV SOLN: 250.0000 mL | INTRAVENOUS | Status: DC | PRN

## 2015-11-24 MED ORDER — HEPARIN SODIUM (PORCINE) 1000 UNIT/ML IJ SOLN
INTRAMUSCULAR | Status: AC
  Filled 2015-11-24: qty 1

## 2015-11-24 MED ORDER — FOLIC ACID 1 MG PO TABS: 1.0000 mg | ORAL_TABLET | Freq: Every day | ORAL | Status: DC

## 2015-11-24 MED ORDER — SODIUM CHLORIDE 0.9 % WEIGHT BASED INFUSION
1.0000 mL/kg/h | INTRAVENOUS | Status: DC
  Administered 2015-11-24: 1 mL/kg/h via INTRAVENOUS

## 2015-11-24 MED ORDER — MIDAZOLAM HCL 2 MG/2ML IJ SOLN
INTRAMUSCULAR | Status: AC
  Filled 2015-11-24: qty 2

## 2015-11-24 MED ORDER — LIDOCAINE HCL (PF) 1 % IJ SOLN
INTRAMUSCULAR | Status: DC | PRN
  Administered 2015-11-24: 3 mL

## 2015-11-24 MED ORDER — LEVETIRACETAM 1000 MG PO TABS: 1000.0000 mg | ORAL_TABLET | Freq: Two times a day (BID) | ORAL | Status: DC

## 2015-11-24 MED ORDER — SIMVASTATIN 20 MG PO TABS: 20.0000 mg | ORAL_TABLET | Freq: Every day | ORAL | Status: AC

## 2015-11-24 MED ORDER — LIDOCAINE HCL (PF) 1 % IJ SOLN
INTRAMUSCULAR | Status: AC
  Filled 2015-11-24: qty 30

## 2015-11-24 MED ORDER — MIDAZOLAM HCL 2 MG/2ML IJ SOLN
INTRAMUSCULAR | Status: DC | PRN
  Administered 2015-11-24: 1 mg via INTRAVENOUS

## 2015-11-24 MED ORDER — IOPAMIDOL (ISOVUE-370) INJECTION 76%
INTRAVENOUS | Status: DC | PRN
  Administered 2015-11-24: 60 mL via INTRAVENOUS

## 2015-11-24 MED ORDER — HEPARIN SODIUM (PORCINE) 1000 UNIT/ML IJ SOLN
INTRAMUSCULAR | Status: DC | PRN
  Administered 2015-11-24: 4000 [IU] via INTRAVENOUS

## 2015-11-24 MED ORDER — FOLIC ACID 1 MG PO TABS: 1.0000 mg | ORAL_TABLET | Freq: Every day | ORAL | Status: AC

## 2015-11-24 MED ORDER — FENTANYL CITRATE (PF) 100 MCG/2ML IJ SOLN
INTRAMUSCULAR | Status: AC
  Filled 2015-11-24: qty 2

## 2015-11-24 MED ORDER — ADULT MULTIVITAMIN W/MINERALS CH: 1.0000 | ORAL_TABLET | Freq: Every day | ORAL | Status: AC

## 2015-11-24 MED ORDER — SODIUM CHLORIDE 0.9% FLUSH
3.0000 mL | Freq: Two times a day (BID) | INTRAVENOUS | Status: DC
  Administered 2015-11-24: 3 mL via INTRAVENOUS

## 2015-11-24 MED ORDER — SIMVASTATIN 20 MG PO TABS: 20.0000 mg | ORAL_TABLET | Freq: Every day | ORAL | Status: DC

## 2015-11-24 MED ORDER — IOPAMIDOL (ISOVUE-370) INJECTION 76%
INTRAVENOUS | Status: AC
  Filled 2015-11-24: qty 100

## 2015-11-24 MED ORDER — FENTANYL CITRATE (PF) 100 MCG/2ML IJ SOLN
INTRAMUSCULAR | Status: DC | PRN
  Administered 2015-11-24: 25 ug via INTRAVENOUS

## 2015-11-24 MED ORDER — THIAMINE HCL 100 MG PO TABS: 100.0000 mg | ORAL_TABLET | Freq: Every day | ORAL | Status: AC

## 2015-11-24 MED ORDER — LABETALOL HCL 5 MG/ML IV SOLN
INTRAVENOUS | Status: AC
  Filled 2015-11-24: qty 4

## 2015-11-24 MED ORDER — VERAPAMIL HCL 2.5 MG/ML IV SOLN
INTRAVENOUS | Status: DC | PRN
  Administered 2015-11-24: 1.2 mL via INTRA_ARTERIAL

## 2015-11-24 MED ORDER — HEPARIN (PORCINE) IN NACL 2-0.9 UNIT/ML-% IJ SOLN
INTRAMUSCULAR | Status: AC
Start: 2015-11-24 — End: 2015-11-24
  Filled 2015-11-24: qty 1500

## 2015-11-24 MED ORDER — LABETALOL HCL 5 MG/ML IV SOLN
INTRAVENOUS | Status: DC | PRN
  Administered 2015-11-24 (×2): 10 mg via INTRAVENOUS

## 2015-11-24 MED ORDER — HEPARIN (PORCINE) IN NACL 2-0.9 UNIT/ML-% IJ SOLN
INTRAMUSCULAR | Status: DC | PRN
  Administered 2015-11-24: 1000 mL

## 2015-11-24 SURGICAL SUPPLY — 8 items
CATH INFINITI 5FR MULTPACK ANG (CATHETERS) ×3 IMPLANT
DEVICE RAD COMP TR BAND LRG (VASCULAR PRODUCTS) ×3 IMPLANT
GLIDESHEATH SLEND A-KIT 6F 22G (SHEATH) ×3 IMPLANT
KIT HEART LEFT (KITS) ×3 IMPLANT
PACK CARDIAC CATHETERIZATION (CUSTOM PROCEDURE TRAY) ×3 IMPLANT
TRANSDUCER W/STOPCOCK (MISCELLANEOUS) ×3 IMPLANT
TUBING CIL FLEX 10 FLL-RA (TUBING) ×3 IMPLANT
WIRE SAFE-T 1.5MM-J .035X260CM (WIRE) ×3 IMPLANT

## 2015-11-24 NOTE — H&P (View-Only) (Signed)
Subjective:  Admitted with DTs and Sz activity. NSTEMI  Objective:  Temp:  [99.4 F (37.4 C)-99.8 F (37.7 C)] 99.8 F (37.7 C) (05/22 0800) Pulse Rate:  [81-89] 88 (05/22 0527) Resp:  [20-25] 24 (05/22 0527) BP: (124-147)/(69-83) 140/78 mmHg (05/22 0800) SpO2:  [97 %-100 %] 97 % (05/22 0800) Weight:  [186 lb 3.2 oz (84.46 kg)] 186 lb 3.2 oz (84.46 kg) (05/22 0527) Weight change: 2 lb 4.8 oz (1.043 kg)  Intake/Output from previous day: 05/21 0701 - 05/22 0700 In: 720 [P.O.:720] Out: 0   Intake/Output from this shift: Total I/O In: 240 [P.O.:240] Out: -   Physical Exam: General appearance: alert and no distress Neck: no adenopathy, no carotid bruit, no JVD, supple, symmetrical, trachea midline and thyroid not enlarged, symmetric, no tenderness/mass/nodules Lungs: clear to auscultation bilaterally Heart: regular rate and rhythm, S1, S2 normal, no murmur, click, rub or gallop Extremities: extremities normal, atraumatic, no cyanosis or edema  Lab Results: Results for orders placed or performed during the hospital encounter of 11/18/15 (from the past 48 hour(s))  Glucose, capillary     Status: Abnormal   Collection Time: 11/21/15  3:55 PM  Result Value Ref Range   Glucose-Capillary 128 (H) 65 - 99 mg/dL  Glucose, capillary     Status: Abnormal   Collection Time: 11/22/15  1:26 AM  Result Value Ref Range   Glucose-Capillary 118 (H) 65 - 99 mg/dL  Lipid panel     Status: Abnormal   Collection Time: 11/22/15  1:40 AM  Result Value Ref Range   Cholesterol 186 0 - 200 mg/dL   Triglycerides 166 (H) <150 mg/dL   HDL 36 (L) >40 mg/dL   Total CHOL/HDL Ratio 5.2 RATIO   VLDL 33 0 - 40 mg/dL   LDL Cholesterol 117 (H) 0 - 99 mg/dL    Comment:        Total Cholesterol/HDL:CHD Risk Coronary Heart Disease Risk Table                     Men   Women  1/2 Average Risk   3.4   3.3  Average Risk       5.0   4.4  2 X Average Risk   9.6   7.1  3 X Average Risk  23.4   11.0      Use the calculated Patient Ratio above and the CHD Risk Table to determine the patient's CHD Risk.        ATP III CLASSIFICATION (LDL):  <100     mg/dL   Optimal  100-129  mg/dL   Near or Above                    Optimal  130-159  mg/dL   Borderline  160-189  mg/dL   High  >190     mg/dL   Very High   Vitamin B12     Status: Abnormal   Collection Time: 11/22/15  1:40 AM  Result Value Ref Range   Vitamin B-12 1008 (H) 180 - 914 pg/mL    Comment: (NOTE) This assay is not validated for testing neonatal or myeloproliferative syndrome specimens for Vitamin B12 levels.   RPR     Status: None   Collection Time: 11/22/15  1:40 AM  Result Value Ref Range   RPR Ser Ql Non Reactive Non Reactive    Comment: (NOTE) Performed At: [facility],  [place] [id number] Lindon Romp MD [id number]   HIV antibody     Status: None   Collection Time: 11/22/15  1:40 AM  Result Value Ref Range   HIV Screen 4th Generation wRfx Non Reactive Non Reactive    Comment: (NOTE) Performed At: [facility] [place], [place] [id number] Lindon Romp MD [id number]   Hemoglobin A1c     Status: None   Collection Time: 11/22/15  1:41 AM  Result Value Ref Range   Hgb A1c MFr Bld 5.1 4.8 - 5.6 %    Comment: (NOTE)         Pre-diabetes: 5.7 - 6.4         Diabetes: >6.4         Glycemic control for adults with diabetes: <7.0    Mean Plasma Glucose 100 mg/dL    Comment: (NOTE) Performed At: [facility] [place], [place] [id number] Lindon Romp MD [id number]   TSH     Status: None   Collection Time: 11/22/15  2:42 AM  Result Value Ref Range   TSH 3.810 0.350 - 4.500 uIU/mL  Basic metabolic panel     Status: Abnormal   Collection Time: 11/22/15  5:00 AM  Result Value Ref Range   Sodium 144 135 - 145 mmol/L   Potassium 2.2 (LL) 3.5 - 5.1 mmol/L    Comment: CRITICAL RESULT CALLED TO, READ BACK BY AND  VERIFIED WITH: BUENDIA,R RN 11/22/2015 0712 JORDANS    Chloride 104 101 - 111 mmol/L   CO2 29 22 - 32 mmol/L   Glucose, Bld 99 65 - 99 mg/dL   BUN <5 (L) 6 - 20 mg/dL   Creatinine, Ser 0.64 0.44 - 1.00 mg/dL   Calcium 8.1 (L) 8.9 - 10.3 mg/dL   GFR calc non Af Amer >60 >60 mL/min   GFR calc Af Amer >60 >60 mL/min    Comment: (NOTE) The eGFR has been calculated using the CKD EPI equation. This calculation has not been validated in all clinical situations. eGFR's persistently <60 mL/min signify possible Chronic Kidney Disease.    Anion gap 11 5 - 15  Magnesium     Status: None   Collection Time: 11/22/15  5:00 AM  Result Value Ref Range   Magnesium 2.0 1.7 - 2.4 mg/dL  Phosphorus     Status: None   Collection Time: 11/22/15  5:00 AM  Result Value Ref Range   Phosphorus 3.3 2.5 - 4.6 mg/dL  CBC     Status: Abnormal   Collection Time: 11/22/15  5:00 AM  Result Value Ref Range   WBC 3.4 (L) 4.0 - 10.5 K/uL   RBC 2.99 (L) 3.87 - 5.11 MIL/uL   Hemoglobin 9.4 (L) 12.0 - 15.0 g/dL   HCT 29.6 (L) 36.0 - 46.0 %   MCV 99.0 78.0 - 100.0 fL   MCH 31.4 26.0 - 34.0 pg   MCHC 31.8 30.0 - 36.0 g/dL   RDW 15.7 (H) 11.5 - 15.5 %   Platelets 132 (L) 150 - 400 K/uL  Basic metabolic panel     Status: Abnormal   Collection Time: 11/23/15  4:19 AM  Result Value Ref Range   Sodium 145 135 - 145 mmol/L   Potassium 3.2 (L) 3.5 - 5.1 mmol/L    Comment: DELTA CHECK NOTED   Chloride 112 (H) 101 - 111 mmol/L   CO2 25 22 - 32 mmol/L   Glucose, Bld 99 65 -  99 mg/dL   BUN <5 (L) 6 - 20 mg/dL   Creatinine, Ser 0.70 0.44 - 1.00 mg/dL   Calcium 8.5 (L) 8.9 - 10.3 mg/dL   GFR calc non Af Amer >60 >60 mL/min   GFR calc Af Amer >60 >60 mL/min    Comment: (NOTE) The eGFR has been calculated using the CKD EPI equation. This calculation has not been validated in all clinical situations. eGFR's persistently <60 mL/min signify possible Chronic Kidney Disease.    Anion gap 8 5 - 15  Magnesium     Status:  None   Collection Time: 11/23/15  4:19 AM  Result Value Ref Range   Magnesium 2.0 1.7 - 2.4 mg/dL  Phosphorus     Status: None   Collection Time: 11/23/15  4:19 AM  Result Value Ref Range   Phosphorus 3.0 2.5 - 4.6 mg/dL  CBC     Status: Abnormal   Collection Time: 11/23/15  4:19 AM  Result Value Ref Range   WBC 4.0 4.0 - 10.5 K/uL   RBC 3.07 (L) 3.87 - 5.11 MIL/uL   Hemoglobin 10.0 (L) 12.0 - 15.0 g/dL   HCT 32.0 (L) 36.0 - 46.0 %   MCV 104.2 (H) 78.0 - 100.0 fL   MCH 32.6 26.0 - 34.0 pg   MCHC 31.3 30.0 - 36.0 g/dL   RDW 16.1 (H) 11.5 - 15.5 %   Platelets 145 (L) 150 - 400 K/uL  Glucose, capillary     Status: None   Collection Time: 11/23/15  8:00 AM  Result Value Ref Range   Glucose-Capillary 99 65 - 99 mg/dL  Glucose, capillary     Status: None   Collection Time: 11/23/15 11:49 AM  Result Value Ref Range   Glucose-Capillary 95 65 - 99 mg/dL    Imaging: Imaging results have been reviewed  Assessment/Plan:   1. Principal Problem: 2.   Delirium tremens ([place]) 3. Active Problems: 4.   Alcohol withdrawal seizure ([place]) 5.   Acute encephalopathy 6.   Encounter for central line placement 7.   CVA (cerebral infarction) 8.   Respiratory failure ([place]) 9.   PRES (posterior reversible encephalopathy syndrome) 10.   Acute embolic stroke ([place]) 11.   Alcohol abuse 12.   Stroke ([place]) 13.   Time Spent Directly with Patient:  20 minutes  Length of Stay:  LOS: 5 days   Pt was admitted 5/17 with DT and Sz. Briefly intubated. W/U was remarkable for + trop  And MB c/w NSTEMI. 2D shows mild LV dysfunction with IL WMA. Exam benign. K being aggressively repleted. Will need cor angio to define anatomy which we will arrange tomorrow.  I have reviewed the risks, indications, and alternatives to cardiac catheterization, possible angioplasty, and stenting with the patient. Risks include but are not limited to bleeding, infection, vascular injury, stroke, myocardial infection, arrhythmia,  kidney injury, radiation-related injury in the case of prolonged fluoroscopy use, emergency cardiac surgery, and death. The patient understands the risks of serious complication is 1-2 in 9244 with diagnostic cardiac cath and 1-2% or less with angioplasty/stenting  Quay Burow 11/23/2015, 2:53 PM

## 2015-11-24 NOTE — Progress Notes (Signed)
Called by RN to look at cath site, cath lab has evaluated as well.  More of a bruise than hematoma.  + pulse ok to go home once post cath orders are complete.

## 2015-11-24 NOTE — Interval H&P Note (Signed)
History and Physical Interval Note:  11/24/2015 10:34 AM  Doy HutchingSusan Ashline  has presented today for surgery, with the diagnosis of n stemi. The various methods of treatment have been discussed with the patient and family. After consideration of risks, benefits and other options for treatment, the patient has consented to  Procedure(s): Left Heart Cath and Coronary Angiography (N/A) with possible Percutaneous Coronary Intervention as a surgical intervention .  The patient's history has been reviewed, patient examined, no change in status, stable for surgery.  I have reviewed the patient's chart and labs.  Questions were answered to the patient's satisfaction.    Cath Lab Visit (complete for each Cath Lab visit)  Clinical Evaluation Leading to the Procedure:   ACS: Yes.    Non-ACS:    Anginal Classification: CCS II  Anti-ischemic medical therapy: No Therapy  Non-Invasive Test Results: No non-invasive testing performed  Prior CABG: No previous CABG   TIMI Score  Patient Information:  TIMI Score is 2  UA/NSTEMI and low-risk features (e.g., TIMI score  Revascularization of the presumed culprit artery   U (6)  Indication: 9; Score: 6    Bryan Lemmaavid Derreon Consalvo

## 2015-11-24 NOTE — Progress Notes (Signed)
Patient Name: Abigail HutchingSusan Hull Date of Encounter: 11/24/2015  Hospital Problem List     Principal Problem:   Delirium tremens [facility](HCC) Active Problems:   Alcohol withdrawal seizure (HCC)   Acute encephalopathy   Encounter for central line placement   CVA (cerebral infarction)   Respiratory failure (HCC)   PRES (posterior reversible encephalopathy syndrome)   Acute embolic stroke (HCC)   Alcohol abuse   Stroke [facility](HCC)   Demand ischemia (HCC)    Subjective   Feels well this morning. Plan for cath this afternoon.   Inpatient Medications    . ampicillin-sulbactam (UNASYN) IV  3 g Intravenous Q8H  . antiseptic oral rinse  7 mL Mouth Rinse BID  . aspirin EC  81 mg Oral Daily  . feeding supplement (PRO-STAT SUGAR FREE 64)  60 mL Per Tube TID  . folic acid  1 mg Oral Daily  . heparin subcutaneous  5,000 Units Subcutaneous Q8H  . levETIRAcetam  1,000 mg Oral BID  . multivitamin with minerals  1 tablet Oral Daily  . pantoprazole  40 mg Oral BID  . simvastatin  20 mg Oral q1800  . sodium chloride flush  10-40 mL Intracatheter Q12H  . sodium chloride flush  3 mL Intravenous Q12H  . thiamine  100 mg Oral Daily   Or  . thiamine  100 mg Intravenous Daily  . vancomycin  1,000 mg Intravenous Q12H    Vital Signs    Filed Vitals:   11/23/15 2040 11/24/15 0016 11/24/15 0525 11/24/15 0730  BP: 123/79 108/54 126/94   Pulse: 80 81    Temp: 99 F (37.2 C) 98.8 F (37.1 C) 98.5 F (36.9 C) 99.2 F (37.3 C)  TempSrc: Oral  Oral Oral  Resp: 20 22    Height:      Weight:   185 lb 6.4 oz (84.097 kg)   SpO2: 98% 100% 98%     Intake/Output Summary (Last 24 hours) at 11/24/15 0820 Last data filed at 11/23/15 1007  Gross per 24 hour  Intake    240 ml  Output      0 ml  Net    240 ml   Filed Weights   11/22/15 0446 11/23/15 0527 11/24/15 0525  Weight: 183 lb 14.4 oz (83.416 kg) 186 lb 3.2 oz (84.46 kg) 185 lb 6.4 oz (84.097 kg)    Physical Exam    General: Pleasant, NAD. Neuro:  Alert and oriented X 3. Moves all extremities spontaneously. Psych: Normal affect. HEENT:  Normal  Neck: Supple without bruits or JVD. Lungs:  Resp regular and unlabored, CTA. Heart: RRR no s3, s4, or murmurs. Abdomen: Soft, non-tender, non-distended, BS + x 4.  Extremities: No clubbing, cyanosis or edema. DP/PT/Radials 2+ and equal bilaterally. PICC placed over night to the right upper arm.   Labs    CBC  Recent Labs  11/22/15 0500 11/23/15 0419  WBC 3.4* 4.0  HGB 9.4* 10.0*  HCT 29.6* 32.0*  MCV 99.0 104.2*  PLT 132* 145*   Basic Metabolic Panel  Recent Labs  11/22/15 0500 11/23/15 0419  NA 144 145  K 2.2* 3.2*  CL 104 112*  CO2 29 25  GLUCOSE 99 99  BUN <5* <5*  CREATININE 0.64 0.70  CALCIUM 8.1* 8.5*  MG 2.0 2.0  PHOS 3.3 3.0   Hemoglobin A1C  Recent Labs  11/22/15 0141  HGBA1C 5.1   Fasting Lipid Panel  Recent Labs  11/22/15 0140  CHOL 186  HDL  36*  LDLCALC 117*  TRIG 166*  CHOLHDL 5.2   Thyroid Function Tests  Recent Labs  11/22/15 0242  TSH 3.810    Telemetry    SR Rate-80s  ECG    No recent  Radiology     Assessment & Plan    1. Elevated Troponin/NSTEMI: Admitted 5/17 for DT with seizures that required intubation for a brief period. Cardiology was consulted on 11/22/2015 in relation to up trend in troponin to 6. In talking with patient, she has concerning risk factors in her family hx, smoking hx, IL WMA and reduced EF. Plan to take for cath this afternoon with Dr. Herbie Baltimore to evaluation for CAD.  --Primary team ordered to place PICC for better access. Was placed to the right upper arm. Discussed with patient that approach will need to be to the right groin.  --Consider adding a statin post cath.   2. Hypokalemia: Primary team is working to replete K, yesterdays K+ 3.2, morning labs pending. Cr stable at 0.70.  3. HTN: Controlled with last couple of readings.   Signed, Laverda Page NP-C Pager [phone]  Agree with  note by Laverda Page NP-C  Pt had cath today that revealed nl cors and LV. Plan medical Rx. Pos trop prob secondary to DTs/Sz   Runell Gess, M.D., Milagros Loll, Kathryne Eriksson [facility] [address] [place], [place]  [address]  [phone] 11/24/2015 12:58 PM

## 2015-11-24 NOTE — Discharge Instructions (Signed)
If problems with wrist call Dr. Hazle CocaBerry's office at [phone] cardiology

## 2015-11-24 NOTE — Progress Notes (Signed)
Physical Therapy Treatment Patient Details Name: Abigail Hull MRN: [id number] DOB: [birth year] Today's Date: 11/24/2015    History of Present Illness 55 year old female past medical history significant for alcohol abuse and hypertension. She typically drinks about 1 pint of hard alcohol daily, and has not had a drink for about 3 days now. She was last seen normal 5/16 at around noon. Since that time she has had significant nausea vomiting, blurry vision, difficulty ambulating, and apparent hallucinations. Her mental status continued to worsen since that time, until her husband witnessed her having a seizure-like episode and called EMS. Upon arrival to [facility] emergency department she was unresponsive and emergently intubated for airway protection. CT of the head showed bilateral areas of bilateral parietal occipital foci of poorly defined hypoattenuation. Ammonia level was also elevated. EEG done 5/17 and was abnormal in the Rt frontal lobe.    PT Comments    Patient seen for OOB mobility. Patient ambulated in hall with assist. Continues to demonstrate balance deficits requiring increased assist to prevent fall. Patient with some SOB, educated on pursed lip breathing. Patient receptive. Will continue to see and progress as tolerated.   Follow Up Recommendations  Home health PT;Supervision for mobility/OOB     Equipment Recommendations  Rolling walker with 5" wheels    Recommendations for Other Services       Precautions / Restrictions Precautions Precautions: Fall Restrictions Weight Bearing Restrictions: No    Mobility  Bed Mobility Overal bed mobility: Needs Assistance Bed Mobility: Supine to Sit;Sit to Supine     Supine to sit: Supervision Sit to supine: Supervision   General bed mobility comments: no physical assist required this session, improved   Transfers Overall transfer level: Needs assistance Equipment used: None   Sit to Stand: Min guard         General  transfer comment: min guard for stability in coming to standing, no physical assist required  Ambulation/Gait Ambulation/Gait assistance: Min assist Ambulation Distance (Feet): 240 Feet Assistive device: None Gait Pattern/deviations: Step-through pattern;Decreased stride length;Staggering right;Drifts right/left Gait velocity: decreased Gait velocity interpretation: Below normal speed for age/gender General Gait Details: patient continues to have several incidents of staggering gait requiring assist to stabilize during ambulation. One extended standing rest break secondary to modest SOB. Educated on pursed lip breathing. patient receptive   Information systems manager Rankin (Stroke Patients Only)       Balance Overall balance assessment: Needs assistance   Sitting balance-Leahy Scale: Fair Sitting balance - Comments: Close min guard assist for safety   Standing balance support: No upper extremity supported Standing balance-Leahy Scale: Poor Standing balance comment: some instability noted in static standing, increased lateral sway             High level balance activites: Side stepping;Direction changes;Turns;Head turns High Level Balance Comments: min assist for balance during higher level tasks    Cognition Arousal/Alertness: Awake/alert Behavior During Therapy: WFL for tasks assessed/performed Overall Cognitive Status: Impaired/Different from baseline Area of Impairment: Attention   Current Attention Level: Selective                Exercises      General Comments        Pertinent Vitals/Pain Pain Assessment: No/denies pain    Home Living                      Prior Function  PT Goals (current goals can now be found in the care plan section) Acute Rehab PT Goals Patient Stated Goal: to go home PT Goal Formulation: With patient/family Time For Goal Achievement: 12/06/15 Potential to Achieve Goals:  Good Progress towards PT goals: Progressing toward goals    Frequency  Min 3X/week    PT Plan Current plan remains appropriate    Co-evaluation             End of Session Equipment Utilized During Treatment: Gait belt Activity Tolerance: Patient tolerated treatment well Patient left: in bed;with call bell/phone within reach;with bed alarm set     Time: [id number] PT Time Calculation (min) (ACUTE ONLY): 18 min  Charges:  $Gait Training: 8-22 mins                    G CodesFabio Asa:      Tanea Moga J 11/24/2015, 8:24 AM Charlotte Crumbevon Krishav Mamone, PT DPT  [phone]

## 2015-11-24 NOTE — Discharge Summary (Deleted)
Physician Discharge Summary  Abigail Hull VWU:[id number] DOB: [birth year] DOA: 11/18/2015  PCP: Abigail Falls, MD  Admit date: 11/18/2015 Discharge date: 11/24/2015   Recommendations for Outpatient Follow-Up:   1. Recommend outpatient referral to neurologist.  She will need a repeat EEG as an outpatient.   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, remote    Respiratory failure (HCC)    PRES (posterior reversible encephalopathy syndrome)    Alcohol abuse    Demand ischemia (HCC)    NSTEMI (non-ST elevated myocardial infarction) (HCC)     Hypotension requiring pressor support    Respiratory failure requiring intubation for airway protection    Suspected aspiration pneumonia with MRSA positive sputum    MRSE bacteremia    Hypokalemia    Hypophosphatemia    Obesity    Pancytopenia    Small vessel cerebrovascular disease    Atherosclerosis of the carotid arteries    Multilevel spondylosis of the cervical spine    High anion gap anabolic acidosis  Discharge disposition:  Home.    Discharge Condition: Improved.  Diet recommendation: Low sodium, heart healthy.    History of Present Illness:   Abigail Hull is an 55 y.o. female with a PMH of alcohol abuse and hypertension who was admitted by the critical care team on 11/18/15 for treatment of delirium tremens and alcohol withdrawal seizure complicated by the need for intubation to protect her airway will stop upon initial presentation, she was also found to have an elevated ammonia level and abnormal CT of the head.  Hospital Course by Problem:   Principal Problem:  Delirium tremens (HCC)/Acute  encephalopathy/alcohol withdrawal seizure in the setting of alcohol abuse Patient was admitted and intubated for airway protection. Placed on propofol on admission. EEG done 11/18/15 and was abnormal with neuronal dysfunction and epileptogenic potential in the right frontal lobe. Placed on Ativan detox protocol post extubation. Continue supplement thiamine and folic acid. Continue Keppra. HIV, RPR Negative. No longer has any symptoms suggestive of withdrawal. The patient was instructed to continue Keppra at discharge and a prescription was called into her pharmacy. Patient was counseled about the importance of cessation of alcohol use and verbalized understanding. Declined inpatient rehabilitation and prefers to go to [facility] and pursue other options for treatment.  Active Problems:  Hypotension requiring pressor support Patient was placed on Neo-Synephrine early on admission due to hypotension despite IV fluid challenge. Remains hemodynamically stable at discharge.   Elevated troponin/demand ischemia/NSTEMI Patient was placed on heparin for concerns for NSTEMI which was continued through 11/21/15. Troponin initially 6.07, trended down subsequently. 2-D echo done 11/18/15 and showed mildly reduced systolic function with an EF of 45-50 percent and akinesis of the basal inferior and inferoseptal myocardium.  Cardiology consultation performed and the patient underwent heart catheterization 11/24/15 with normal findings.   CVA (cerebral infarction)/PRES Initial CT of the head showed findings concerning for acute CVA. MRI of the brain done 11/20/15 and showed 2 focal areas in the left parietal and occipital lobe consistent with questionable infarction as well as PRES. 2-D echo done 11/18/15 and no cardiac source of emboli found. Neurology consultation performed, and ultimately felt the MRI findings were due to PRES  and not infarcts. Speech therapy evaluation done 11/21/15 and diet advanced. Appears to be back  at her prior level of functioning with no focal neurological deficits. Further workup with CT angiogram of the head and neck ordered by neurology and showed persistent subcortical white matter hypoattenuation involving the posterior parietal and occipital lobes bilaterally compatible with PRES. there was evolution of a remote nonhemorrhagic infarct of the left occipital pole and moderate distal small vessel disease throughout the circle of Willis as well as atherosclerotic changes. She will need to continue strict risk factor modification at discharge. Lipid panel showed a cholesterol of 186, LDL 117. Statin started and discharged on Zocor. Instructed to take a daily aspirin at discharge. Hemoglobin A1c normal at 5.1%. The patient does need neurological follow-up.   Respiratory failure (HCC) Intubated on admission for airway protection, subsequently extubated 11/21/15. Respiratory status remains stable.   High anion gap metabolic acidosis Vigorously hydrated. Resolved.   Possible aspiration pneumonia with MRSA positive sputum culture/MRSE bacteremia Initially placed on Unasyn. Vancomycin added 11/20/15 due to MRSA positive sputum. Blood cultures grew MRSE. Repeat blood cultures done 11/21/15, negative to date. Patient has no symptoms suggestive of a clinically significant pneumonia and her chest x-rays were clear. Discussed these findings with Dr. Ninetta Lights of ID who agrees that no further antibiotics are needed at discharge.   Hypokalemia/hypophosphatemia Electrolytes monitored closely and replaced as needed while in the hospital.   Obesity Evaluated by dietitian. Tube feedings initiated 11/19/15. Diet now advanced and NG tube removed.   Pancytopenia Likely secondary to the toxic effects of alcohol on the bone marrow.    Medical Consultants:    None.   Discharge Exam:   Filed Vitals:   11/24/15 1124 11/24/15 1153  BP: 147/97 153/96  Pulse: 0 67  Temp:    Resp: 29 21   Filed  Vitals:   11/24/15 1114 11/24/15 1119 11/24/15 1124 11/24/15 1153  BP: 185/109 175/106 147/97 153/96  Pulse: 84 73 0 67  Temp:      TempSrc:      Resp: 18 17 29 21   Height:      Weight:      SpO2: 98% 0% 0% 96%   General exam: Appears calm and comfortable.  Respiratory system: Clear to auscultation. Respiratory effort normal. Cardiovascular system: S1 & S2 heard, RRR. No JVD, rubs, gallops or clicks. No murmurs. Gastrointestinal system: Abdomen is nondistended, soft and nontender. No organomegaly or masses felt. Normal bowel sounds heard. Central nervous system: Alert and oriented. No focal neurological deficits. Extremities: No clubbing, edema, or cyanosis. Skin: No rashes, lesions or ulcers Psychiatry: Judgement and insight appear normal. Mood & affect appropriate.   The results of significant diagnostics from this hospitalization (including imaging, microbiology, ancillary and laboratory) are listed below for reference.     Procedures and Diagnostic Studies:   Ct Head Wo Contrast  11/18/2015  CLINICAL DATA:  Acute encephalopathy. Rule out intracranial hemorrhage before starting heparin. Bilateral parietal occipital hypo attenuation on outside CT per EMR, presumed stroke EXAM: CT HEAD WITHOUT CONTRAST TECHNIQUE: Contiguous axial images were obtained from the base of the skull through the vertex without intravenous contrast. COMPARISON:  None. FINDINGS: Skull and Sinuses:Negative for fracture or destructive process. The visualized mastoids, middle ears, and imaged paranasal sinuses are clear. Advanced right TMJ arthritis. Visualized orbits: Negative. Brain: There is cortical and subcortical low-density fairly symmetrically in the occipital and parietal regions without superimposed hemorrhage. No high-density vessel sign. Elsewhere normal appearance of the  brain. No hydrocephalus. IMPRESSION: Symmetric parietal-occipital cortically based edema or infarct with pattern suggesting posterior  reversible encephalopathy syndrome. No superimposed hemorrhage. Electronically Signed   By: Marnee Spring M.D.   On: 11/18/2015 11:55   Ct Abdomen Pelvis W Contrast  11/18/2015  CLINICAL DATA:  Evaluate the source of blood from NG tube, intubated EXAM: CT ABDOMEN AND PELVIS WITH CONTRAST TECHNIQUE: Multidetector CT imaging of the abdomen and pelvis was performed using the standard protocol following bolus administration of intravenous contrast. CONTRAST:  ISOVUE-300 IOPAMIDOL (ISOVUE-300) INJECTION 61% COMPARISON:  None. FINDINGS: Lower chest: Bilateral tiny pleural effusion. There is bilateral lower lobe posterior small atelectasis or infiltrate. Hepatobiliary: Significant fatty infiltration of the liver. Mild hepatomegaly. There is thickening of gallbladder wall up to 6.5 mm. Probable gallbladder sludge. There is a calcified gallstone in gallbladder neck region measures 9.5 mm. No intrahepatic biliary ductal dilatation. No CBD dilatation. Pancreas: Enhanced pancreas is unremarkable. Spleen: Enhanced spleen is unremarkable. Adrenals/Urinary Tract: No adrenal gland mass. Enhanced kidneys are symmetrical in size. No hydronephrosis or hydroureter. Delayed renal images shows bilateral renal symmetrical excretion. Bilateral visualized proximal ureter is unremarkable. There is mild distended urinary bladder. A Foley catheter is noted within urinary bladder. Small amount of air within urinary bladder anteriorly is probable post instrumentation. Stomach/Bowel: There is no gastric outlet obstruction. There is a NG tube in place with tip in proximal stomach against the lateral gastric wall. There is no evidence of gastric wall hematoma adjacent to tip of the NG tube at. No evidence of intraluminal hematoma. No pericecal inflammation. Terminal ileum is unremarkable. Normal appendix partially visualized in axial image 72. There is no distal colonic obstruction. The right colon descending colon and sigmoid colon is  empty collapsed. Some colonic gas noted in transverse colon. No small bowel obstruction. No thickened or dilated small bowel loops. Vascular/Lymphatic: There is no retroperitoneal or mesenteric adenopathy. No aortic aneurysm. Reproductive: The uterus and ovaries are unremarkable. The uterus is small size. No adnexal mass. Other: No ascites or free air. No retroperitoneal or mesenteric hematoma. No inguinal adenopathy. Musculoskeletal: No destructive bony lesions are noted. Degenerative changes are noted lumbar spine. Probable Schmorl's node deformity noted upper endplate of L4 vertebral body. Significant disc space flattening with vacuum disc phenomenon mild anterior spurring and endplate sclerotic changes at L1-L2 and L2-L3 level. Mild upper lumbar levoscoliosis. IMPRESSION: 1. There is significant fatty infiltration of the liver. Mild hepatomegaly. 2. There is thickening of gallbladder wall up to 6.5 mm. Clinical correlation is necessary to exclude cholecystitis. Further correlation with gallbladder ultrasound could be performed as clinically warranted. Probable sludge within gallbladder lumen. There is a calcified gallstone in neck of the gallbladder measures 9.5 mm. 3. There is NG tube with tip in proximal stomach with tip against the lateral gastric wall. There is no evidence of intraluminal or mural hematoma. 4. Tiny bilateral pleural effusion with bilateral basilar posterior atelectasis or infiltrate. 5. Normal appendix.  No pericecal inflammation. 6. No small bowel obstruction. 7. Mild distended urinary bladder. Small amount of air within urinary bladder probable post instrumentation. The bladder catheter is noted. 8. Unremarkable uterus and ovaries. 9. Degenerative changes lumbar spine. Electronically Signed   By: Natasha Mead M.D.   On: 11/18/2015 12:59   Dg Chest Port 1 View  11/18/2015  CLINICAL DATA:  Status post central line placement EXAM: PORTABLE CHEST 1 VIEW COMPARISON:  11/18/2015 FINDINGS:  Endotracheal tube and nasogastric catheter are again seen and stable. A new left  jugular central line is noted with the catheter tip in the mid superior vena cava. No pneumothorax is noted. Some crowding of the vascular markings is noted due to a poor inspiratory effort. IMPRESSION: No pneumothorax following central line placement. Electronically Signed   By: Alcide Clever M.D.   On: 11/18/2015 11:19   Dg Chest Port 1 View  11/18/2015  CLINICAL DATA:  Endotracheal tube.  History of hypertension. EXAM: PORTABLE CHEST 1 VIEW COMPARISON:  None. FINDINGS: Endotracheal tube placed with tip measuring 3.5 cm above the carina. Enteric tube is present with tip off of the field of view but below the left hemidiaphragm. Heart size and pulmonary vascularity are normal. Tortuous aorta. Prominent right paratracheal shadow is likely vascular. No focal airspace disease or consolidation in the lungs. No blunting of costophrenic angles. No pneumothorax. IMPRESSION: Appliances appear in satisfactory position. No evidence of active pulmonary disease. Electronically Signed   By: Burman Nieves M.D.   On: 11/18/2015 06:48     Labs:   Basic Metabolic Panel:  Recent Labs Lab 11/19/15 0545 11/19/15 1350 11/20/15 0610 11/21/15 0600 11/22/15 0500 11/23/15 0419 11/24/15 0845  NA 145  --  146* 146* 144 145 142  K 3.1* 3.8 3.2* 2.7* 2.2* 3.2* 3.8  CL 110  --  111 104 104 112* 109  CO2 23  --  GLUCOSE 99  --  107* 88 99 99 100*  BUN <5*  --  8 <5* <5* <5* <5*  CREATININE 0.77  --  0.61 0.70 0.64 0.70 0.72  CALCIUM 7.5*  --  8.0* 8.4* 8.1* 8.5* 8.9  MG 2.1  --  1.8 1.7 2.0 2.0  --   PHOS 1.9* 2.5 2.5 2.4* 3.3 3.0  --    GFR Estimated Creatinine Clearance: 83.6 mL/min (by C-G formula based on Cr of 0.72). Liver Function Tests:  Recent Labs Lab 11/18/15 0643 11/20/15 0610  AST 182* 74*  ALT 51 35  ALKPHOS 66 71  BILITOT 1.4* 0.8  PROT 5.1* 5.1*  ALBUMIN 3.0* 2.5*    Recent Labs Lab  11/18/15 0643  LIPASE 24  AMYLASE 64    Recent Labs Lab 11/18/15 0704  AMMONIA 27   Coagulation profile  Recent Labs Lab 11/18/15 0643  INR 1.09    CBC:  Recent Labs Lab 11/18/15 0643 11/19/15 1050 11/19/15 1700 11/20/15 0610 11/21/15 0600 11/22/15 0500 11/23/15 0419  WBC 5.3 4.9  --  6.7 5.3 3.4* 4.0  NEUTROABS 4.4  --   --   --   --   --   --   HGB 11.0* 9.5* 9.9* 9.8* 9.9* 9.4* 10.0*  HCT 32.3* 29.4* 30.9* 31.4* 31.7* 29.6* 32.0*  MCV 98.2 99.3  --  101.3* 100.3* 99.0 104.2*  PLT 64* 68*  --  94* 122* 132* 145*   Cardiac Enzymes:  Recent Labs Lab 11/18/15 0643 11/18/15 1200 11/18/15 1839  CKTOTAL 268* 252*  --   CKMB  --  27.0*  --   TROPONINI 6.07* 3.26* 1.90*   BNP: Invalid input(s): POCBNP CBG:  Recent Labs Lab 11/21/15 1118 11/21/15 1555 11/22/15 0126 11/23/15 0800 11/23/15 1149  GLUCAP 131* 128* 118* 99 95   D-Dimer No results for input(s): DDIMER in the last 72 hours. Hgb A1c  Recent Labs  11/22/15 0141  HGBA1C 5.1   Lipid Profile  Recent Labs  11/22/15 0140 11/24/15 0845  CHOL 186  --   HDL 36*  --  LDLCALC 117*  --   TRIG 166* 214*  CHOLHDL 5.2  --    Thyroid function studies  Recent Labs  11/22/15 0242  TSH 3.810   Anemia work up  Recent Labs  11/22/15 0140  VITAMINB12 1008*   Microbiology Recent Results (from the past 240 hour(s))  MRSA PCR Screening     Status: Abnormal   Collection Time: 11/18/15  5:25 AM  Result Value Ref Range Status   MRSA by PCR POSITIVE (A) NEGATIVE Final    Comment:        The GeneXpert MRSA Assay (FDA approved for NASAL specimens only), is one component of a comprehensive MRSA colonization surveillance program. It is not intended to diagnose MRSA infection nor to guide or monitor treatment for MRSA infections. RESULT CALLED TO, READ BACK BY AND VERIFIED WITH: Vernie Shanks RN 9:35 11/18/15 ([email or web address])   Culture, respiratory (NON-Expectorated)     Status: Abnormal    Collection Time: 11/18/15 10:50 AM  Result Value Ref Range Status   Specimen Description TRACHEAL ASPIRATE  Final   Special Requests Normal  Final   Gram Stain   Final    ABUNDANT WBC PRESENT,BOTH PMN AND MONONUCLEAR NO SQUAMOUS EPITHELIAL CELLS SEEN RARE GRAM POSITIVE COCCI IN PAIRS Performed at [facility]    Culture (A)  Final    METHICILLIN RESISTANT STAPHYLOCOCCUS AUREUS Note: RIFAMPIN AND GENTAMICIN SHOULD NOT BE USED AS SINGLE DRUGS FOR TREATMENT OF STAPH INFECTIONS. This organism DOES NOT demonstrate inducible Clindamycin resistance in vitro. Performed at [facility]    Report Status 11/22/2015 FINAL  Final   Organism ID, Bacteria METHICILLIN RESISTANT STAPHYLOCOCCUS AUREUS  Final      Susceptibility   Methicillin resistant staphylococcus aureus - MIC*    CLINDAMYCIN <=0.25 SENSITIVE Sensitive     ERYTHROMYCIN >=8 RESISTANT Resistant     GENTAMICIN <=0.5 SENSITIVE Sensitive     LEVOFLOXACIN 4 INTERMEDIATE Intermediate     OXACILLIN >=4 RESISTANT Resistant     RIFAMPIN <=0.5 SENSITIVE Sensitive     TRIMETH/SULFA <=10 SENSITIVE Sensitive     VANCOMYCIN 1 SENSITIVE Sensitive     TETRACYCLINE <=1 SENSITIVE Sensitive     * METHICILLIN RESISTANT STAPHYLOCOCCUS AUREUS  Culture, blood (routine x 2)     Status: Abnormal   Collection Time: 11/18/15 12:00 PM  Result Value Ref Range Status   Specimen Description BLOOD RIGHT ARM  Final   Special Requests BOTTLES DRAWN AEROBIC AND ANAEROBIC 5CC  Final   Culture  Setup Time   Final    GRAM POSITIVE COCCI IN CLUSTERS AEROBIC BOTTLE ONLY Organism ID to follow CRITICAL RESULT CALLED TO, READ BACK BY AND VERIFIED WITH: E. MARTIN, PHARM D AT 1130 ON [id number] BY M. WILSON    Culture (A)  Final    STAPHYLOCOCCUS SPECIES (COAGULASE NEGATIVE) THE SIGNIFICANCE OF ISOLATING THIS ORGANISM FROM A SINGLE SET OF BLOOD CULTURES WHEN MULTIPLE SETS ARE DRAWN IS UNCERTAIN. PLEASE NOTIFY THE MICROBIOLOGY DEPARTMENT WITHIN ONE WEEK IF  SPECIATION AND SENSITIVITIES ARE REQUIRED.    Report Status 11/21/2015 FINAL  Final  Blood Culture ID Panel (Reflexed)     Status: Abnormal   Collection Time: 11/18/15 12:00 PM  Result Value Ref Range Status   Enterococcus species NOT DETECTED NOT DETECTED Final   Vancomycin resistance NOT DETECTED NOT DETECTED Final   Listeria monocytogenes NOT DETECTED NOT DETECTED Final   Staphylococcus species DETECTED (A) NOT DETECTED Final    Comment: CRITICAL RESULT CALLED TO, READ BACK  BY AND VERIFIED WITH: Veronda PrudeE. Martin Pharm.D. 11:30 11/19/15 (wilsonm)    Staphylococcus aureus NOT DETECTED NOT DETECTED Final   Methicillin resistance DETECTED (A) NOT DETECTED Final    Comment: CRITICAL RESULT CALLED TO, READ BACK BY AND VERIFIED WITH: EBrain Hilts. Martin Pharm.D. 11:30 11/19/15 (wilsonm)    Streptococcus species NOT DETECTED NOT DETECTED Final   Streptococcus agalactiae NOT DETECTED NOT DETECTED Final   Streptococcus pneumoniae NOT DETECTED NOT DETECTED Final   Streptococcus pyogenes NOT DETECTED NOT DETECTED Final   Acinetobacter baumannii NOT DETECTED NOT DETECTED Final   Enterobacteriaceae species NOT DETECTED NOT DETECTED Final   Enterobacter cloacae complex NOT DETECTED NOT DETECTED Final   Escherichia coli NOT DETECTED NOT DETECTED Final   Klebsiella oxytoca NOT DETECTED NOT DETECTED Final   Klebsiella pneumoniae NOT DETECTED NOT DETECTED Final   Proteus species NOT DETECTED NOT DETECTED Final   Serratia marcescens NOT DETECTED NOT DETECTED Final   Carbapenem resistance NOT DETECTED NOT DETECTED Final   Haemophilus influenzae NOT DETECTED NOT DETECTED Final   Neisseria meningitidis NOT DETECTED NOT DETECTED Final   Pseudomonas aeruginosa NOT DETECTED NOT DETECTED Final   Candida albicans NOT DETECTED NOT DETECTED Final   Candida glabrata NOT DETECTED NOT DETECTED Final   Candida krusei NOT DETECTED NOT DETECTED Final   Candida parapsilosis NOT DETECTED NOT DETECTED Final   Candida tropicalis NOT  DETECTED NOT DETECTED Final  Culture, blood (routine x 2)     Status: None   Collection Time: 11/18/15 12:09 PM  Result Value Ref Range Status   Specimen Description BLOOD RIGHT HAND  Final   Special Requests BOTTLES DRAWN AEROBIC ONLY 7CC  Final   Culture NO GROWTH 5 DAYS  Final   Report Status 11/23/2015 FINAL  Final  C difficile quick scan w PCR reflex     Status: None   Collection Time: 11/20/15 12:48 AM  Result Value Ref Range Status   C Diff antigen NEGATIVE NEGATIVE Final   C Diff toxin NEGATIVE NEGATIVE Final   C Diff interpretation Negative for toxigenic C. difficile  Final  Culture, blood (routine x 2)     Status: None (Preliminary result)   Collection Time: 11/20/15 10:40 AM  Result Value Ref Range Status   Specimen Description BLOOD RIGHT ANTECUBITAL  Final   Special Requests IN PEDIATRIC BOTTLE 2CC  Final   Culture NO GROWTH 4 DAYS  Final   Report Status PENDING  Incomplete  Culture, blood (routine x 2)     Status: None (Preliminary result)   Collection Time: 11/20/15 10:46 AM  Result Value Ref Range Status   Specimen Description BLOOD LEFT ANTECUBITAL  Final   Special Requests IN PEDIATRIC BOTTLE 3CC  Final   Culture NO GROWTH 4 DAYS  Final   Report Status PENDING  Incomplete     Discharge Instructions:       Discharge Instructions    Ambulatory referral to Neurology    Complete by:  As directed   [facility] Darrol [facility]arolyn Martin[facility] about 2 [facility]. Thanks.     Call MD for:    Complete by:  As directed   Recurrent seizures.     Diet - low sodium heart healthy    Complete by:  As directed      Increase activity slowly    Complete by:  As directed             Medication List    TAKE these medications  aspirin 81 MG EC tablet  Take 1 tablet (81 mg total) by mouth daily.     folic acid 1 MG tablet  Commonly known as:  FOLVITE  Take 1 tablet (1 mg total) by mouth daily.     levETIRAcetam 1000 MG tablet  Commonly known as:  KEPPRA    Take 1 tablet (1,000 mg total) by mouth 2 (two) times daily.     multivitamin with minerals Tabs tablet  Take 1 tablet by mouth daily.     simvastatin 20 MG tablet  Commonly known as:  ZOCOR  Take 1 tablet (20 mg total) by mouth daily at 6 PM.     thiamine 100 MG tablet  Take 1 tablet (100 mg total) by mouth daily.       Follow-up Information    Follow up with Nilda Riggs, NP. Schedule an appointment as soon as possible for a visit in 2 months.   Specialty:  Family Medicine   Why:  stroke clinic   Contact information:   [address] [place] [address] [phone]       Follow up with Abigail Falls, MD. Schedule an appointment as soon as possible for a visit in 1 week.   Specialty:  Nurse Practitioner   Why:  Hospital follow up.   Contact information:   [address] [place] [address] [phone]        Time coordinating discharge: 35 minutes.  Signed:  RAMA,CHRISTINA  Pager [phone] [facility] 11/24/2015, 5:18 PM

## 2015-11-24 NOTE — Care Management Note (Addendum)
Case Management Note  Patient Details  Name: Abigail HutchingSusan Hull MRN: [id number] Date of Birth: 02/01/1961  Subjective/Objective:   Pt admitted for Alcohol Withdrawal, seizure disorder and was intubated/ Extubated 11-21-15. Pt with increased troponin and post cath 11-24-15. [name] husband [name]. Pt is without insurance and a job. Per pt daughter who is a Respiratory Therapist will be at home with her during the day.                  Action/Plan: CM discussed plans for [employer]. Pt did refuse services since she has no insurance. Pt stated her son-n-law is a Charity fundraiserN and can assist her at home as well. CM did discuss DME and pt is refusing DME at this time. CM will try to assist with medications in the am. CM will call [facility] on [address] in [place] to get cash prices on medications and [facility] on [address]. CM will continue to monitor for additional needs.    Expected Discharge Date:                  Expected Discharge Plan:  Home/Self Care  In-House Referral:  Clinical Social Work  Discharge planning Services  CM Consult, Medication Assistance  Post Acute Care Choice:  NA Choice offered to:  NA  DME Arranged:  N/A DME Agency:  NA  HH Arranged:  NA HH Agency:  NA  Status of Service:  Completed, signed off  Medicare Important Message Given:    Date Medicare IM Given:    Medicare IM give by:    Date Additional Medicare IM Given:    Additional Medicare Important Message give by:     If discussed at Long Length of Stay Meetings, dates discussed:    Additional Comments: 0949 11-25-15 Tomi BambergerBrenda Graves-Bigelow, RN,BSN [phone] CM did call pt at home to verify if she was able to get her medications. Per pt she stated her son-n-law was able to assist her with medications. No further needs from CM.   [name], [name], RN 11/24/2015, 4:12 PM

## 2015-11-24 NOTE — Progress Notes (Signed)
Asked to withdraw air for left TR band.  Upon assessment patient's wrist below TR band with significantly bruising, swollen, soft, with 2+ pulse.  Primary nurse Melissa stated left radial site has been that way since patient received from cath lab.  Cath Lab technician up to assess patient left radial site.  Stated site was swollen but soft and nontender.  Pressure held for about 5 minutes and site did not change.  Patient stated bruising was on wrist prior to procedure. Cath Lab staff instructed nursing staff to continue to remove TR band.   Nada BoozerLaura Ingold NP paged to come and assess, stated patient was ok to discharge home as long as left wrist/radial site stayed the same/did not get any worse.  Primary nurses Efraim KaufmannMelissa and [facility] aware.   Colman Caterarpley, Kawika Bischoff Danielle

## 2015-11-25 LAB — CULTURE, BLOOD (ROUTINE X 2)
Culture: NO GROWTH
Culture: NO GROWTH

## 2015-11-27 NOTE — Discharge Summary (Signed)
Physician Discharge Summary  Abigail Hull ZOX:[id number] DOB: 1960/10/10 DOA: 11/18/2015  PCP: [name], MD  Admit date: 11/18/2015 Discharge date: 11/24/2015   Recommendations for Outpatient Follow-Up:   1. Recommend outpatient referral to neurologist.  She will need a repeat EEG as an outpatient.   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, remote    Respiratory failure (HCC)    PRES (posterior reversible encephalopathy syndrome)    Alcohol abuse    Demand ischemia (HCC)    NSTEMI (non-ST elevated myocardial infarction) (HCC)     Hypotension requiring pressor support    Respiratory failure requiring intubation for airway protection    Suspected aspiration pneumonia with MRSA positive sputum    MRSE bacteremia    Hypokalemia    Hypophosphatemia    Obesity    Pancytopenia    Small vessel cerebrovascular disease    Atherosclerosis of the carotid arteries    Multilevel spondylosis of the cervical spine    High anion gap anabolic acidosis  Discharge disposition:  Home.    Discharge Condition: Improved.  Diet recommendation: Low sodium, heart healthy.    History of Present Illness:   Abigail Hull is an 55 y.o. female with a PMH of alcohol abuse and hypertension who was admitted by the critical care team on 11/18/15 for treatment of delirium tremens and alcohol withdrawal seizure complicated by the need for intubation to protect her airway will stop upon initial presentation, she was also found to have an elevated ammonia level and abnormal CT of the head.  Hospital Course by Problem:   Principal Problem:  Delirium tremens (HCC)/Acute  encephalopathy/alcohol withdrawal seizure in the setting of alcohol abuse Patient was admitted and intubated for airway protection. Placed on propofol on admission. EEG done 11/18/15 and was abnormal with neuronal dysfunction and epileptogenic potential in the right frontal lobe. Placed on Ativan detox protocol post extubation. Continue supplement thiamine and folic acid. Continue Keppra. HIV, RPR Negative. No longer has any symptoms suggestive of withdrawal. The patient was instructed to continue Keppra at discharge and a prescription was called into her pharmacy. Patient was counseled about the importance of cessation of alcohol use and verbalized understanding. Declined inpatient rehabilitation and prefers to go to [facility] and pursue other options for treatment.  Active Problems:  Hypotension requiring pressor support Patient was placed on Neo-Synephrine early on admission due to hypotension despite IV fluid challenge. Remains hemodynamically stable at discharge.   Elevated troponin/demand ischemia/NSTEMI Patient was placed on heparin for concerns for NSTEMI which was continued through 11/21/15. Troponin initially 6.07, trended down subsequently. 2-D echo done 11/18/15 and showed mildly reduced systolic function with an EF of 45-50 percent and akinesis of the basal inferior and inferoseptal myocardium.  Cardiology consultation performed and the patient underwent heart catheterization 11/24/15 with normal findings.   CVA (cerebral infarction)/PRES Initial CT of the head showed findings concerning for acute CVA. MRI of the brain done 11/20/15 and showed 2 focal areas in the left parietal and occipital lobe consistent with questionable infarction as well as PRES. 2-D echo done 11/18/15 and no cardiac source of emboli found. Neurology consultation performed, and ultimately felt the MRI findings were due to PRES  and not infarcts. Speech therapy evaluation done 11/21/15 and diet advanced. Appears to be back  at her prior level of functioning with no focal neurological deficits. Further workup with CT angiogram of the head and neck ordered by neurology and showed persistent subcortical white matter hypoattenuation involving the posterior parietal and occipital lobes bilaterally compatible with PRES. there was evolution of a remote nonhemorrhagic infarct of the left occipital pole and moderate distal small vessel disease throughout the circle of Willis as well as atherosclerotic changes. She will need to continue strict risk factor modification at discharge. Lipid panel showed a cholesterol of 186, LDL 117. Statin started and discharged on Zocor. Instructed to take a daily aspirin at discharge. Hemoglobin A1c normal at 5.1%. The patient does need neurological follow-up.   Respiratory failure (HCC) Intubated on admission for airway protection, subsequently extubated 11/21/15. Respiratory status remains stable.   High anion gap metabolic acidosis Vigorously hydrated. Resolved.   Possible aspiration pneumonia with MRSA positive sputum culture/MRSE bacteremia Initially placed on Unasyn. Vancomycin added 11/20/15 due to MRSA positive sputum. Blood cultures grew MRSE. Repeat blood cultures done 11/21/15, negative to date. Patient has no symptoms suggestive of a clinically significant pneumonia and her chest x-rays were clear. Discussed these findings with Dr. Ninetta Lights of ID who agrees that no further antibiotics are needed at discharge.   Hypokalemia/hypophosphatemia Electrolytes monitored closely and replaced as needed while in the hospital.   Obesity Evaluated by dietitian. Tube feedings initiated 11/19/15. Diet now advanced and NG tube removed.   Pancytopenia Likely secondary to the toxic effects of alcohol on the bone marrow.    Medical Consultants:    None.   Discharge Exam:   Filed Vitals:   11/24/15 1900 11/24/15 2000  BP: 150/88 153/88  Pulse:  80  Temp:  99 F (37.2 C)  Resp: 21 17     Filed Vitals:   11/24/15 1700 11/24/15 1800 11/24/15 1900 11/24/15 2000  BP: 136/88 137/94 150/88 153/88  Pulse:    80  Temp:    99 F (37.2 C)  TempSrc:    Oral  Resp: 21 24 21 17   Height:      Weight:      SpO2:    99%   General exam: Appears calm and comfortable.  Respiratory system: Clear to auscultation. Respiratory effort normal. Cardiovascular system: S1 & S2 heard, RRR. No JVD, rubs, gallops or clicks. No murmurs. Gastrointestinal system: Abdomen is nondistended, soft and nontender. No organomegaly or masses felt. Normal bowel sounds heard. Central nervous system: Alert and oriented. No focal neurological deficits. Extremities: No clubbing, edema, or cyanosis. Skin: No rashes, lesions or ulcers Psychiatry: Judgement and insight appear normal. Mood & affect appropriate.   The results of significant diagnostics from this hospitalization (including imaging, microbiology, ancillary and laboratory) are listed below for reference.     Procedures and Diagnostic Studies:   Ct Head Wo Contrast  11/18/2015  CLINICAL DATA:  Acute encephalopathy. Rule out intracranial hemorrhage before starting heparin. Bilateral parietal occipital hypo attenuation on outside CT per EMR, presumed stroke EXAM: CT HEAD WITHOUT CONTRAST TECHNIQUE: Contiguous axial images were obtained from the base of the skull through the vertex without intravenous contrast. COMPARISON:  None. FINDINGS: Skull and Sinuses:Negative for fracture or destructive process. The visualized mastoids, middle ears, and imaged paranasal sinuses are clear. Advanced right TMJ arthritis. Visualized orbits: Negative. Brain: There is cortical and subcortical low-density fairly symmetrically in the occipital and parietal regions without superimposed hemorrhage. No high-density  vessel sign. Elsewhere normal appearance of the brain. No hydrocephalus. IMPRESSION: Symmetric parietal-occipital cortically based edema or infarct with pattern  suggesting posterior reversible encephalopathy syndrome. No superimposed hemorrhage. Electronically Signed   By: Marnee SpringJonathon  Watts M.D.   On: 11/18/2015 11:55   Ct Abdomen Pelvis W Contrast  11/18/2015  CLINICAL DATA:  Evaluate the source of blood from NG tube, intubated EXAM: CT ABDOMEN AND PELVIS WITH CONTRAST TECHNIQUE: Multidetector CT imaging of the abdomen and pelvis was performed using the standard protocol following bolus administration of intravenous contrast. CONTRAST:  100mL ISOVUE-300 IOPAMIDOL (ISOVUE-300) INJECTION 61% COMPARISON:  None. FINDINGS: Lower chest: Bilateral tiny pleural effusion. There is bilateral lower lobe posterior small atelectasis or infiltrate. Hepatobiliary: Significant fatty infiltration of the liver. Mild hepatomegaly. There is thickening of gallbladder wall up to 6.5 mm. Probable gallbladder sludge. There is a calcified gallstone in gallbladder neck region measures 9.5 mm. No intrahepatic biliary ductal dilatation. No CBD dilatation. Pancreas: Enhanced pancreas is unremarkable. Spleen: Enhanced spleen is unremarkable. Adrenals/Urinary Tract: No adrenal gland mass. Enhanced kidneys are symmetrical in size. No hydronephrosis or hydroureter. Delayed renal images shows bilateral renal symmetrical excretion. Bilateral visualized proximal ureter is unremarkable. There is mild distended urinary bladder. A Foley catheter is noted within urinary bladder. Small amount of air within urinary bladder anteriorly is probable post instrumentation. Stomach/Bowel: There is no gastric outlet obstruction. There is a NG tube in place with tip in proximal stomach against the lateral gastric wall. There is no evidence of gastric wall hematoma adjacent to tip of the NG tube at. No evidence of intraluminal hematoma. No pericecal inflammation. Terminal ileum is unremarkable. Normal appendix partially visualized in axial image 72. There is no distal colonic obstruction. The right colon descending colon  and sigmoid colon is empty collapsed. Some colonic gas noted in transverse colon. No small bowel obstruction. No thickened or dilated small bowel loops. Vascular/Lymphatic: There is no retroperitoneal or mesenteric adenopathy. No aortic aneurysm. Reproductive: The uterus and ovaries are unremarkable. The uterus is small size. No adnexal mass. Other: No ascites or free air. No retroperitoneal or mesenteric hematoma. No inguinal adenopathy. Musculoskeletal: No destructive bony lesions are noted. Degenerative changes are noted lumbar spine. Probable Schmorl's node deformity noted upper endplate of L4 vertebral body. Significant disc space flattening with vacuum disc phenomenon mild anterior spurring and endplate sclerotic changes at L1-L2 and L2-L3 level. Mild upper lumbar levoscoliosis. IMPRESSION: 1. There is significant fatty infiltration of the liver. Mild hepatomegaly. 2. There is thickening of gallbladder wall up to 6.5 mm. Clinical correlation is necessary to exclude cholecystitis. Further correlation with gallbladder ultrasound could be performed as clinically warranted. Probable sludge within gallbladder lumen. There is a calcified gallstone in neck of the gallbladder measures 9.5 mm. 3. There is NG tube with tip in proximal stomach with tip against the lateral gastric wall. There is no evidence of intraluminal or mural hematoma. 4. Tiny bilateral pleural effusion with bilateral basilar posterior atelectasis or infiltrate. 5. Normal appendix.  No pericecal inflammation. 6. No small bowel obstruction. 7. Mild distended urinary bladder. Small amount of air within urinary bladder probable post instrumentation. The bladder catheter is noted. 8. Unremarkable uterus and ovaries. 9. Degenerative changes lumbar spine. Electronically Signed   By: Natasha MeadLiviu  Pop M.D.   On: 11/18/2015 12:59   Dg Chest Port 1 View  11/18/2015  CLINICAL DATA:  Status post central line placement EXAM: PORTABLE CHEST 1 VIEW COMPARISON:   11/18/2015 FINDINGS: Endotracheal tube and nasogastric catheter are  again seen and stable. A new left jugular central line is noted with the catheter tip in the mid superior vena cava. No pneumothorax is noted. Some crowding of the vascular markings is noted due to a poor inspiratory effort. IMPRESSION: No pneumothorax following central line placement. Electronically Signed   By: Alcide Clever M.D.   On: 11/18/2015 11:19   Dg Chest Port 1 View  11/18/2015  CLINICAL DATA:  Endotracheal tube.  History of hypertension. EXAM: PORTABLE CHEST 1 VIEW COMPARISON:  None. FINDINGS: Endotracheal tube placed with tip measuring 3.5 cm above the carina. Enteric tube is present with tip off of the field of view but below the left hemidiaphragm. Heart size and pulmonary vascularity are normal. Tortuous aorta. Prominent right paratracheal shadow is likely vascular. No focal airspace disease or consolidation in the lungs. No blunting of costophrenic angles. No pneumothorax. IMPRESSION: Appliances appear in satisfactory position. No evidence of active pulmonary disease. Electronically Signed   By: Burman Nieves M.D.   On: 11/18/2015 06:48     Labs:   Basic Metabolic Panel:  Recent Labs Lab 11/21/15 0600 11/22/15 0500 11/23/15 0419 11/24/15 0845  NA 146* 144 145 142  K 2.7* 2.2* 3.2* 3.8  CL 104 104 112* 109  CO2 26 29 25 26   GLUCOSE 88 99 99 100*  BUN <5* <5* <5* <5*  CREATININE 0.70 0.64 0.70 0.72  CALCIUM 8.4* 8.1* 8.5* 8.9  MG 1.7 2.0 2.0  --   PHOS 2.4* 3.3 3.0  --    GFR Estimated Creatinine Clearance: 83.6 mL/min (by C-G formula based on Cr of 0.72). Liver Function Tests: No results for input(s): AST, ALT, ALKPHOS, BILITOT, PROT, ALBUMIN in the last 168 hours. No results for input(s): LIPASE, AMYLASE in the last 168 hours. No results for input(s): AMMONIA in the last 168 hours. Coagulation profile No results for input(s): INR, PROTIME in the last 168 hours.  CBC:  Recent Labs Lab  11/21/15 0600 11/22/15 0500 11/23/15 0419  WBC 5.3 3.4* 4.0  HGB 9.9* 9.4* 10.0*  HCT 31.7* 29.6* 32.0*  MCV 100.3* 99.0 104.2*  PLT 122* 132* 145*   Cardiac Enzymes: No results for input(s): CKTOTAL, CKMB, CKMBINDEX, TROPONINI in the last 168 hours. BNP: Invalid input(s): POCBNP CBG:  Recent Labs Lab 11/21/15 1118 11/21/15 1555 11/22/15 0126 11/23/15 0800 11/23/15 1149  GLUCAP 131* 128* 118* 99 95   D-Dimer No results for input(s): DDIMER in the last 72 hours. Hgb A1c No results for input(s): HGBA1C in the last 72 hours. Lipid Profile  Recent Labs  11/24/15 0845  TRIG 214*   Thyroid function studies No results for input(s): TSH, T4TOTAL, T3FREE, THYROIDAB in the last 72 hours.  Invalid input(s): FREET3 Anemia work up No results for input(s): VITAMINB12, FOLATE, FERRITIN, TIBC, IRON, RETICCTPCT in the last 72 hours. Microbiology Recent Results (from the past 240 hour(s))  MRSA PCR Screening     Status: Abnormal   Collection Time: 11/18/15  5:25 AM  Result Value Ref Range Status   MRSA by PCR POSITIVE (A) NEGATIVE Final    Comment:        The GeneXpert MRSA Assay (FDA approved for NASAL specimens only), is one component of a comprehensive MRSA colonization surveillance program. It is not intended to diagnose MRSA infection nor to guide or monitor treatment for MRSA infections. RESULT CALLED TO, READ BACK BY AND VERIFIED WITH: Vernie Shanks RN 9:35 11/18/15 ([email or web address])   Culture, respiratory (NON-Expectorated)     Status:  Abnormal   Collection Time: 11/18/15 10:50 AM  Result Value Ref Range Status   Specimen Description TRACHEAL ASPIRATE  Final   Special Requests Normal  Final   Gram Stain   Final    ABUNDANT WBC PRESENT,BOTH PMN AND MONONUCLEAR NO SQUAMOUS EPITHELIAL CELLS SEEN RARE GRAM POSITIVE COCCI IN PAIRS Performed at [facility]    Culture (A)  Final    METHICILLIN RESISTANT STAPHYLOCOCCUS AUREUS Note: RIFAMPIN AND GENTAMICIN SHOULD  NOT BE USED AS SINGLE DRUGS FOR TREATMENT OF STAPH INFECTIONS. This organism DOES NOT demonstrate inducible Clindamycin resistance in vitro. Performed at [facility]    Report Status 11/22/2015 FINAL  Final   Organism ID, Bacteria METHICILLIN RESISTANT STAPHYLOCOCCUS AUREUS  Final      Susceptibility   Methicillin resistant staphylococcus aureus - MIC*    CLINDAMYCIN <=0.25 SENSITIVE Sensitive     ERYTHROMYCIN >=8 RESISTANT Resistant     GENTAMICIN <=0.5 SENSITIVE Sensitive     LEVOFLOXACIN 4 INTERMEDIATE Intermediate     OXACILLIN >=4 RESISTANT Resistant     RIFAMPIN <=0.5 SENSITIVE Sensitive     TRIMETH/SULFA <=10 SENSITIVE Sensitive     VANCOMYCIN 1 SENSITIVE Sensitive     TETRACYCLINE <=1 SENSITIVE Sensitive     * METHICILLIN RESISTANT STAPHYLOCOCCUS AUREUS  Culture, blood (routine x 2)     Status: Abnormal   Collection Time: 11/18/15 12:00 PM  Result Value Ref Range Status   Specimen Description BLOOD RIGHT ARM  Final   Special Requests BOTTLES DRAWN AEROBIC AND ANAEROBIC 5CC  Final   Culture  Setup Time   Final    GRAM POSITIVE COCCI IN CLUSTERS AEROBIC BOTTLE ONLY Organism ID to follow CRITICAL RESULT CALLED TO, READ BACK BY AND VERIFIED WITH: E. MARTIN, PHARM D AT 1130 ON [id number] BY M. WILSON    Culture (A)  Final    STAPHYLOCOCCUS SPECIES (COAGULASE NEGATIVE) THE SIGNIFICANCE OF ISOLATING THIS ORGANISM FROM A SINGLE SET OF BLOOD CULTURES WHEN MULTIPLE SETS ARE DRAWN IS UNCERTAIN. PLEASE NOTIFY THE MICROBIOLOGY DEPARTMENT WITHIN ONE WEEK IF SPECIATION AND SENSITIVITIES ARE REQUIRED.    Report Status 11/21/2015 FINAL  Final  Blood Culture ID Panel (Reflexed)     Status: Abnormal   Collection Time: 11/18/15 12:00 PM  Result Value Ref Range Status   Enterococcus species NOT DETECTED NOT DETECTED Final   Vancomycin resistance NOT DETECTED NOT DETECTED Final   Listeria monocytogenes NOT DETECTED NOT DETECTED Final   Staphylococcus species DETECTED (A) NOT DETECTED  Final    Comment: CRITICAL RESULT CALLED TO, READ BACK BY AND VERIFIED WITH: Veronda Prude.D. 11:30 11/19/15 ([email or web address])    Staphylococcus aureus NOT DETECTED NOT DETECTED Final   Methicillin resistance DETECTED (A) NOT DETECTED Final    Comment: CRITICAL RESULT CALLED TO, READ BACK BY AND VERIFIED WITH: EBrain Hilts.D. 11:30 11/19/15 ([email or web address])    Streptococcus species NOT DETECTED NOT DETECTED Final   Streptococcus agalactiae NOT DETECTED NOT DETECTED Final   Streptococcus pneumoniae NOT DETECTED NOT DETECTED Final   Streptococcus pyogenes NOT DETECTED NOT DETECTED Final   Acinetobacter baumannii NOT DETECTED NOT DETECTED Final   Enterobacteriaceae species NOT DETECTED NOT DETECTED Final   Enterobacter cloacae complex NOT DETECTED NOT DETECTED Final   Escherichia coli NOT DETECTED NOT DETECTED Final   Klebsiella oxytoca NOT DETECTED NOT DETECTED Final   Klebsiella pneumoniae NOT DETECTED NOT DETECTED Final   Proteus species NOT DETECTED NOT DETECTED Final   Serratia marcescens NOT DETECTED NOT DETECTED Final  Carbapenem resistance NOT DETECTED NOT DETECTED Final   Haemophilus influenzae NOT DETECTED NOT DETECTED Final   Neisseria meningitidis NOT DETECTED NOT DETECTED Final   Pseudomonas aeruginosa NOT DETECTED NOT DETECTED Final   Candida albicans NOT DETECTED NOT DETECTED Final   Candida glabrata NOT DETECTED NOT DETECTED Final   Candida krusei NOT DETECTED NOT DETECTED Final   Candida parapsilosis NOT DETECTED NOT DETECTED Final   Candida tropicalis NOT DETECTED NOT DETECTED Final  Culture, blood (routine x 2)     Status: None   Collection Time: 11/18/15 12:09 PM  Result Value Ref Range Status   Specimen Description BLOOD RIGHT HAND  Final   Special Requests BOTTLES DRAWN AEROBIC ONLY 7CC  Final   Culture NO GROWTH 5 DAYS  Final   Report Status 11/23/2015 FINAL  Final  C difficile quick scan w PCR reflex     Status: None   Collection Time: 11/20/15 12:48 AM  Result Value  Ref Range Status   C Diff antigen NEGATIVE NEGATIVE Final   C Diff toxin NEGATIVE NEGATIVE Final   C Diff interpretation Negative for toxigenic C. difficile  Final  Culture, blood (routine x 2)     Status: None   Collection Time: 11/20/15 10:40 AM  Result Value Ref Range Status   Specimen Description BLOOD RIGHT ANTECUBITAL  Final   Special Requests IN PEDIATRIC BOTTLE 2CC  Final   Culture NO GROWTH 5 DAYS  Final   Report Status 11/25/2015 FINAL  Final  Culture, blood (routine x 2)     Status: None   Collection Time: 11/20/15 10:46 AM  Result Value Ref Range Status   Specimen Description BLOOD LEFT ANTECUBITAL  Final   Special Requests IN PEDIATRIC BOTTLE 3CC  Final   Culture NO GROWTH 5 DAYS  Final   Report Status 11/25/2015 FINAL  Final     Discharge Instructions:       Discharge Instructions    Ambulatory referral to Neurology    Complete by:  As directed   Follow up with Darrol Angel, NP, at [facility] in about 2 months. Thanks.     Call MD for:    Complete by:  As directed   Recurrent seizures.     Diet - low sodium heart healthy    Complete by:  As directed      Increase activity slowly    Complete by:  As directed             Medication List    TAKE these medications        aspirin 81 MG EC tablet  Take 1 tablet (81 mg total) by mouth daily.     folic acid 1 MG tablet  Commonly known as:  FOLVITE  Take 1 tablet (1 mg total) by mouth daily.     levETIRAcetam 1000 MG tablet  Commonly known as:  KEPPRA  Take 1 tablet (1,000 mg total) by mouth 2 (two) times daily.     multivitamin with minerals Tabs tablet  Take 1 tablet by mouth daily.     simvastatin 20 MG tablet  Commonly known as:  ZOCOR  Take 1 tablet (20 mg total) by mouth daily at 6 PM.     thiamine 100 MG tablet  Take 1 tablet (100 mg total) by mouth daily.       Follow-up Information    Follow up with Nilda Riggs, NP. Schedule an appointment as soon as possible for a visit in 2  months.   Specialty:  Family Medicine   Why:  stroke clinic   Contact information:   [address] [place] [address] [phone]       Follow up with [name], MD. Schedule an appointment as soon as possible for a visit in 1 week.   Specialty:  Nurse Practitioner   Why:  Hospital follow up.   Contact information:   [address] [place] [address] [place] [address] [phone]        Time coordinating discharge: 35 minutes.  Signed:  RAMA,CHRISTINA  Pager [phone] [facility] 11/27/2015, 7:16 AM

## 2016-01-27 ENCOUNTER — Ambulatory Visit: Payer: MEDICAID | Admitting: Nurse Practitioner

## 2016-02-03 ENCOUNTER — Ambulatory Visit (INDEPENDENT_AMBULATORY_CARE_PROVIDER_SITE_OTHER): Payer: Self-pay | Admitting: Nurse Practitioner

## 2016-02-03 ENCOUNTER — Encounter: Payer: Self-pay | Admitting: Nurse Practitioner

## 2016-02-03 VITALS — BP 140/86 | HR 68 | Ht 63.0 in | Wt 176.0 lb

## 2016-02-03 DIAGNOSIS — I6783 Posterior reversible encephalopathy syndrome: Secondary | ICD-10-CM

## 2016-02-03 DIAGNOSIS — G9340 Encephalopathy, unspecified: Secondary | ICD-10-CM

## 2016-02-03 DIAGNOSIS — F10231 Alcohol dependence with withdrawal delirium: Secondary | ICD-10-CM

## 2016-02-03 DIAGNOSIS — R569 Unspecified convulsions: Secondary | ICD-10-CM

## 2016-02-03 MED ORDER — LEVETIRACETAM 1000 MG PO TABS: 1000.0000 mg | ORAL_TABLET | Freq: Two times a day (BID) | ORAL | 6 refills | Status: AC

## 2016-02-03 NOTE — Patient Instructions (Signed)
Stressed the importance of management of risk factors to prevent further stroke Continue aspirin for secondary stroke prevention Maintain strict control of hypertension with blood pressure goal below 130/90, today's reading140/86  LDL cholesterol less than 70, followed by primary care,  most recent 117 continue statin drugs Exercise by walking, slowly increase , eat healthy diet with whole grains,  fresh fruits and vegetables Discussed risk for recurrent stroke/ TIA and answered additional questio schedule EEG  continue Keppra at current dose  Follow-up in 3 months

## 2016-02-03 NOTE — Progress Notes (Signed)
[facility]  PATIENT: Abigail Hull DOB: 07-Oct-1960   REASON FOR VISIT: Hospital follow-up for stroke, seizure disorder  HISTORY FROM: Patient    HISTORY OF PRESENT ILLNESS:Abigail Hull is an 55 y.o. female with a PMH of alcohol abuse and hypertension who was admitted by the critical care team on 11/18/15 for treatment of delirium tremens and alcohol withdrawal seizure complicated by the need for intubation to protect her airway.She was also found to have an elevated ammonia level and abnormal CT of the head. MRI of the brain with posterior reversible encephalopathy syndrome. 2 focal areas with restricted diffusion in the left parietal and occipital may represent nonreversible areas of infarction. CTA of head and neck normal EEG with frequent right frontal abnormal blood form discharges. 2-D echo EF 45-50%. LDL 117 hemoglobin A1c 5.1. She was placed on Keppra thousand milligrams twice daily and aspirin 0.81mg . in addition she was placed on Zocor. She returns for follow-up visit today without further stroke or TIA symptoms in addition she has had no further seizure activity. She denies alcohol use. She needs repeat EEG   REVIEW OF SYSTEMS: Full 14 system review of systems performed and notable only for those listed, all others are neg:  Constitutional: neg  Cardiovascular: neg Ear/Nose/Throat: neg  Skin: neg Eyes: neg Respiratory: Cough wheezing Gastroitestinal: neg  Hematology/Lymphatic: Easy bruising  Endocrine: neg Musculoskeletal:neg Allergy/Immunology: neg Neurological: neg Psychiatric: Anxiety Sleep : neg   ALLERGIES: No Known Allergies  HOME MEDICATIONS: Outpatient Medications Prior to Visit  Medication Sig Dispense Refill  . aspirin EC 81 MG EC tablet Take 1 tablet (81 mg total) by mouth daily.    . folic acid (FOLVITE) 1 MG tablet Take 1 tablet (1 mg total) by mouth daily.    Marland Kitchen levETIRAcetam (KEPPRA) 1000 MG tablet Take 1 tablet (1,000 mg total) by  mouth 2 (two) times daily. 60 tablet 2  . Multiple Vitamin (MULTIVITAMIN WITH MINERALS) TABS tablet Take 1 tablet by mouth daily.    . simvastatin (ZOCOR) 20 MG tablet Take 1 tablet (20 mg total) by mouth daily at 6 PM. 30 tablet 2  . thiamine 100 MG tablet Take 1 tablet (100 mg total) by mouth daily.     No facility-administered medications prior to visit.     PAST MEDICAL HISTORY: Past Medical History:  Diagnosis Date  . Alcohol abuse   . Alcohol withdrawal seizure (HCC) 11/18/2015  . Cholelithiasis   . Demand ischemia (HCC) 11/23/2015  . Depression   . Hyperlipemia   . Hypertension   . NSTEMI (non-ST elevated myocardial infarction) (HCC)   . PRES (posterior reversible encephalopathy syndrome)     PAST SURGICAL HISTORY: Past Surgical History:  Procedure Laterality Date  . CARDIAC CATHETERIZATION N/A 11/24/2015   Procedure: Left Heart Cath and Coronary Angiography;  Surgeon: Marykay Lex, MD;  Location: [facility] INVASIVE CV LAB;  Service: Cardiovascular;  Laterality: N/A;    FAMILY HISTORY: Family History  Problem Relation Age of Onset  . Coronary artery disease Mother   . Seizures Father     SOCIAL HISTORY: Social History   Social History  . Marital status: Married    Spouse name: N/A  . Number of children: N/A  . Years of education: N/A   Occupational History  . Not on file.   Social History Main Topics  . Smoking status: Current Every Day Smoker    Types: E-cigarettes  . Smokeless tobacco: Never Used  . Alcohol use 42.0 oz/week  70 Shots of liquor per week  . Drug use: Unknown  . Sexual activity: No   Other Topics Concern  . Not on file   Social History Narrative  . No narrative on file     PHYSICAL EXAM  Vitals:   02/03/16 1447  BP: 140/86  Pulse: 68  Weight: 176 lb (79.8 kg)  Height: 5\' 3"  (1.6 m)   Body mass index is 31.18 kg/m.  Generalized: Well developed, in no acute distress  Head: normocephalic and atraumatic,. Oropharynx benign    Neck: Supple, no carotid bruits  Cardiac: Regular rate rhythm, no murmur  Musculoskeletal: No deformity   Neurological examination   Mentation: Alert oriented to time, place, history taking. Attention span and concentration appropriate. Recent and remote memory intact.  Follows all commands speech and language fluent.   Cranial nerve II-XII: Fundoscopic exam reveals sharp disc margins.Pupils were equal round reactive to light extraocular movements were full, visual field were full on confrontational test. Facial sensation and strength were normal. hearing was intact to finger rubbing bilaterally. Uvula tongue midline. head turning and shoulder shrug were normal and symmetric.Tongue protrusion into cheek strength was normal. Motor: normal bulk and tone, full strength in the BUE, BLE, fine finger movements normal, no pronator drift. No focal weakness Sensory: normal and symmetric to light touch, pinprick, and  Vibration, in the upper and lower extremities Coordination: finger-nose-finger, heel-to-shin bilaterally, no dysmetria Reflexes: 1+ upper lower and symmetric plantar responses were flexor bilaterally. Gait and Station: Rising up from seated position without assistance, normal stance,  moderate stride, good arm swing, smooth turning, able to perform tiptoe, and heel walking without difficulty. Tandem gait is steady and Romberg is negative  DIAGNOSTIC DATA (LABS, IMAGING, TESTING) - I reviewed patient records, labs, notes, testing and imaging myself where available.  Lab Results  Component Value Date   WBC 4.0 11/23/2015   HGB 10.0 (L) 11/23/2015   HCT 32.0 (L) 11/23/2015   MCV 104.2 (H) 11/23/2015   PLT 145 (L) 11/23/2015      Component Value Date/Time   NA 142 11/24/2015 0845   K 3.8 11/24/2015 0845   CL 109 11/24/2015 0845   CO2 26 11/24/2015 0845   GLUCOSE 100 (H) 11/24/2015 0845   BUN <5 (L) 11/24/2015 0845   CREATININE 0.72 11/24/2015 0845   CALCIUM 8.9 11/24/2015 0845    PROT 5.1 (L) 11/20/2015 0610   ALBUMIN 2.5 (L) 11/20/2015 0610   AST 74 (H) 11/20/2015 0610   ALT 35 11/20/2015 0610   ALKPHOS 71 11/20/2015 0610   BILITOT 0.8 11/20/2015 0610   GFRNONAA >60 11/24/2015 0845   GFRAA >60 11/24/2015 0845   Lab Results  Component Value Date   CHOL 186 11/22/2015   HDL 36 (L) 11/22/2015   LDLCALC 117 (H) 11/22/2015   TRIG 214 (H) 11/24/2015   CHOLHDL 5.2 11/22/2015   Lab Results  Component Value Date   HGBA1C 5.1 11/22/2015   Lab Results  Component Value Date   VITAMINB12 1,008 (H) 11/22/2015   Lab Results  Component Value Date   TSH 3.810 11/22/2015      ASSESSMENT AND PLAN  55 y.o. year old female  has a past medical history of Alcohol abuse; Alcohol withdrawal seizure (HCC) (11/18/2015);  Hyperlipemia; Hypertension; NSTEMI (non-ST elevated myocardial infarction) (HCC); and PRES (posterior reversible encephalopathy syndrome). here to follow-up The patient is a current patient of Dr. Roda Shutters  who is out of the office today . This note is  sent to the work in doctor.     PLAN: Stressed the importance of management of risk factors to prevent further stroke Continue aspirin for secondary stroke prevention Maintain strict control of hypertension with blood pressure goal below 130/90, today's reading140/86  LDL cholesterol less than 70, followed by primary care,  most recent 117 continue statin drugs Exercise by walking, slowly increase , eat healthy diet with whole grains,  fresh fruits and vegetables Discussed risk for recurrent stroke/ TIA and answered additional questio schedule EEG  continue Keppra at current dose  Follow-up in 3 months Nilda Riggs, [facility], [facility], APRN  [facility] [address] [place], [place] [address] [phone]

## 2016-02-04 NOTE — Progress Notes (Signed)
I have read the note, and I agree with the clinical assessment and plan.  Richard A. Sater, MD, PhD Certified in Neurology, Clinical Neurophysiology, Sleep Medicine, Pain Medicine and Neuroimaging  [facility] [address], [address] [place], [place] [address] [phone]  

## 2016-03-01 ENCOUNTER — Other Ambulatory Visit: Payer: 59

## 2016-05-09 ENCOUNTER — Ambulatory Visit: Payer: 59 | Admitting: Nurse Practitioner

## 2016-05-10 ENCOUNTER — Encounter: Payer: Self-pay | Admitting: Nurse Practitioner

## 2017-07-31 IMAGING — CT CT HEAD W/O CM
4 series · 16 of 47 positions shown, 18 images · non-contrast
Comparison: None.

CLINICAL DATA: Acute encephalopathy. Rule out intracranial
hemorrhage before starting heparin. Bilateral parietal occipital
hypo attenuation on outside CT per EMR, presumed stroke

EXAM:
CT HEAD WITHOUT CONTRAST
TECHNIQUE: Contiguous axial images were obtained from the base of the skull
through the vertex without intravenous contrast.

[Series 2: head without · axial · non-contrast · 0.49mm/px · z∈[-126,-1]mm · 7 of 35 slices shown, 9 images]
[im 5/35  brain]
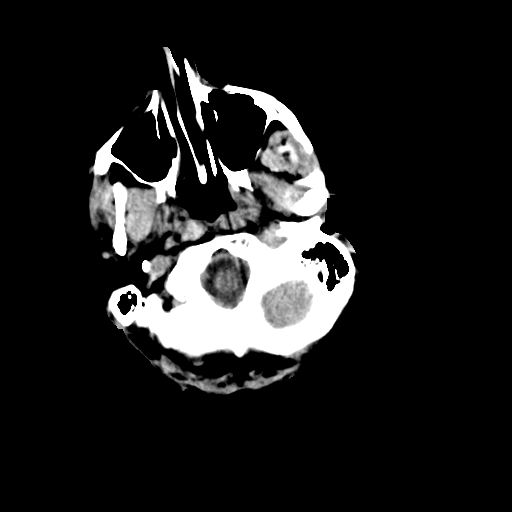
[im 5/35  bone]
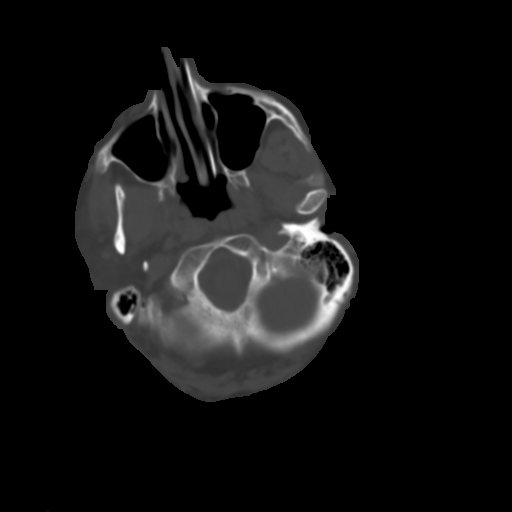
[im 9/35  brain]
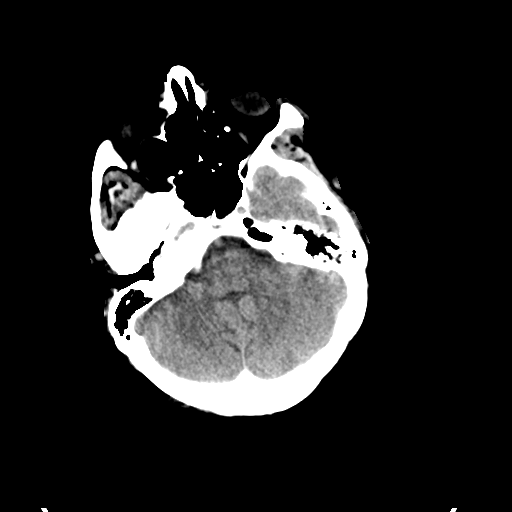
[im 13/35  brain]
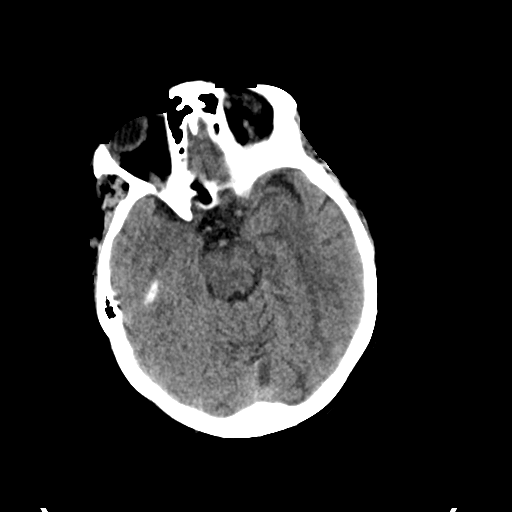
[im 18/35  brain]
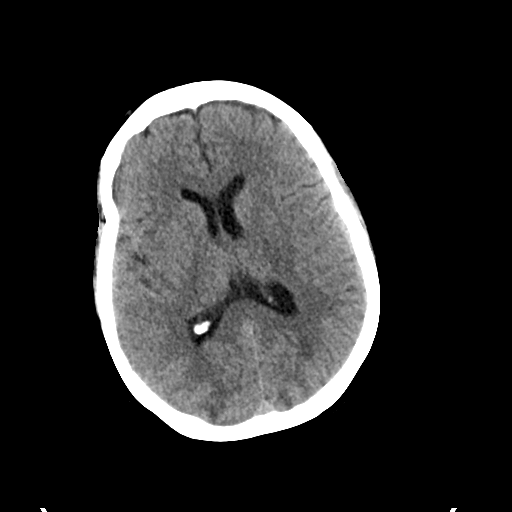
[im 22/35  brain]
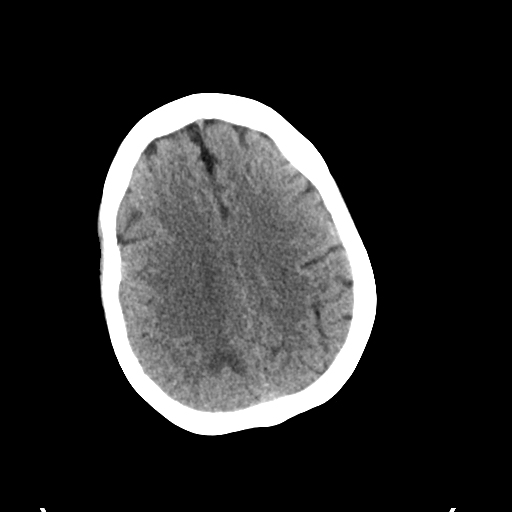
[im 22/35  bone]
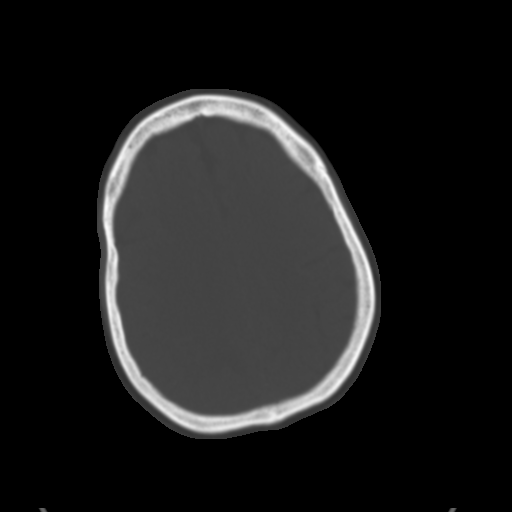
[im 26/35  brain]
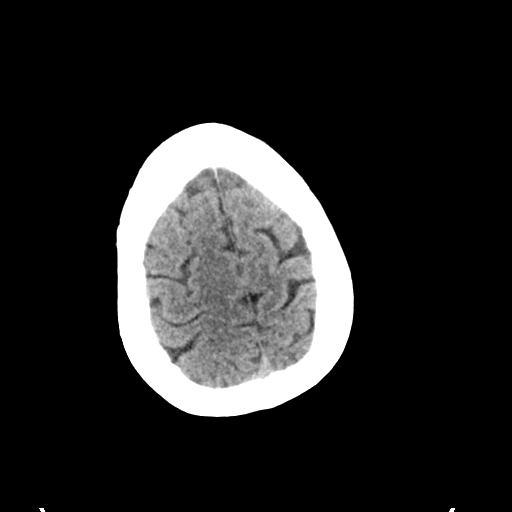
[im 30/35  brain]
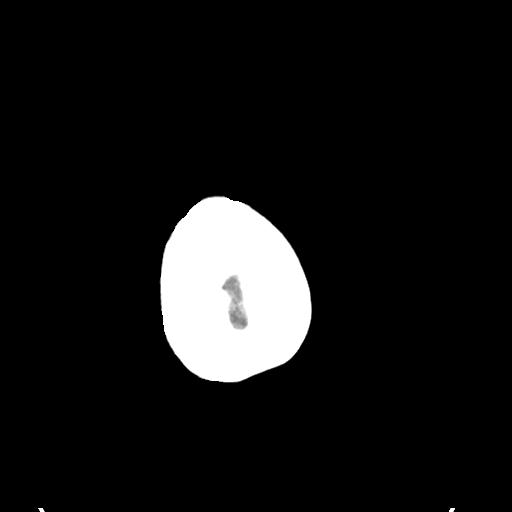

[Series 3: head bone · axial · 0.49mm/px · z∈[-130,-96]mm · 3 of 87 slices shown]
[im 9/87  bone]
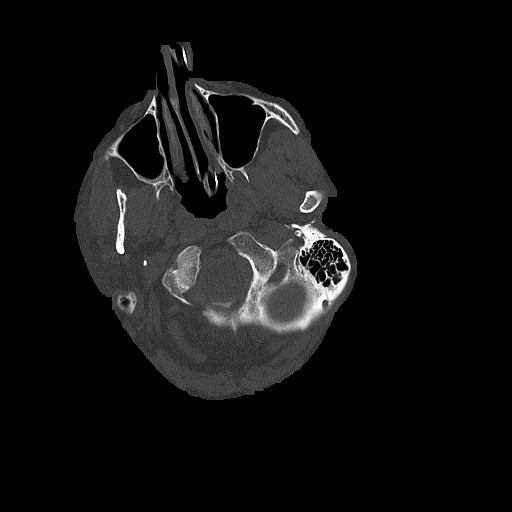
[im 18/87  bone]
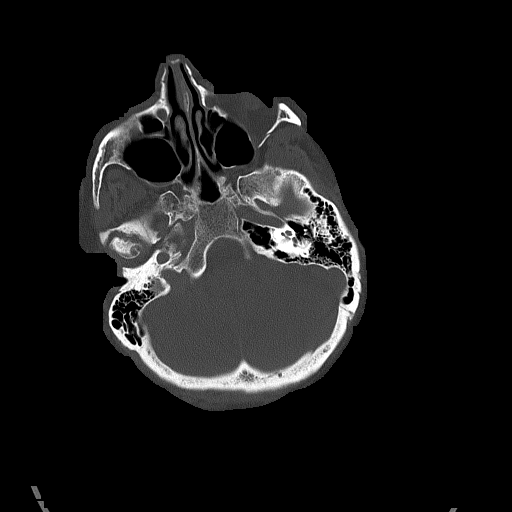
[im 26/87  bone]
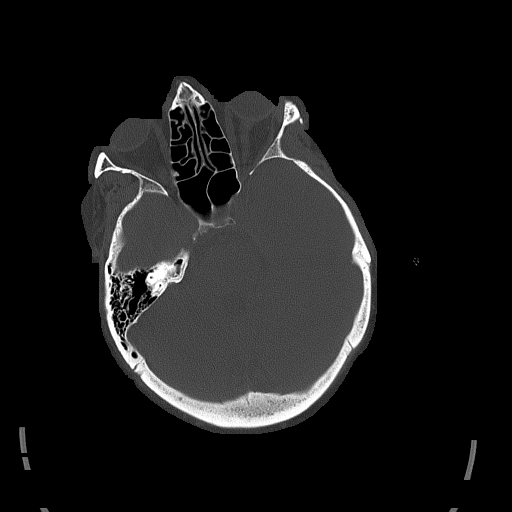

[Series 4: head without cor · coronal · non-contrast · 0.32mm/px · 3 of 67 slices shown]
[im 23/67  brain]
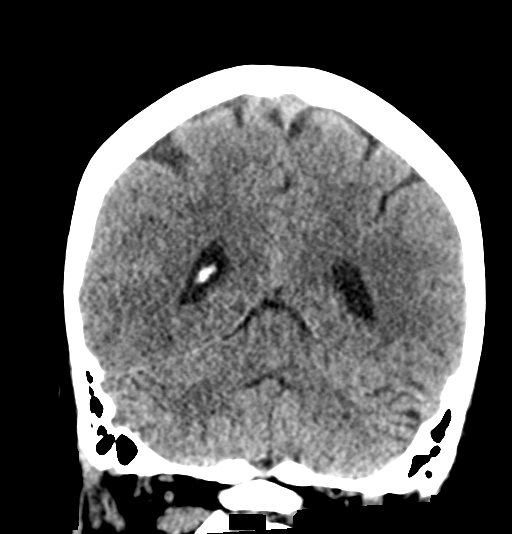
[im 30/67  brain]
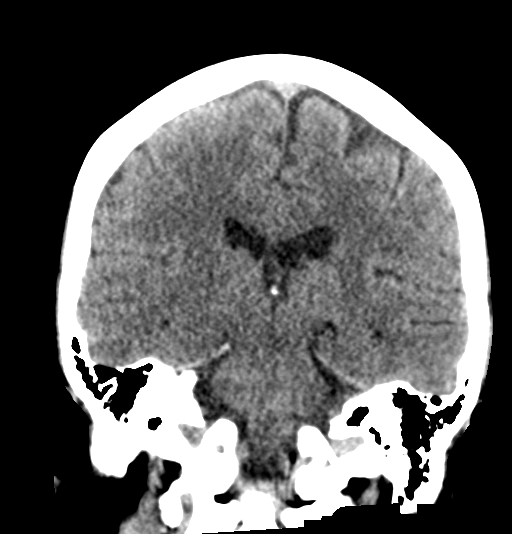
[im 37/67  brain]
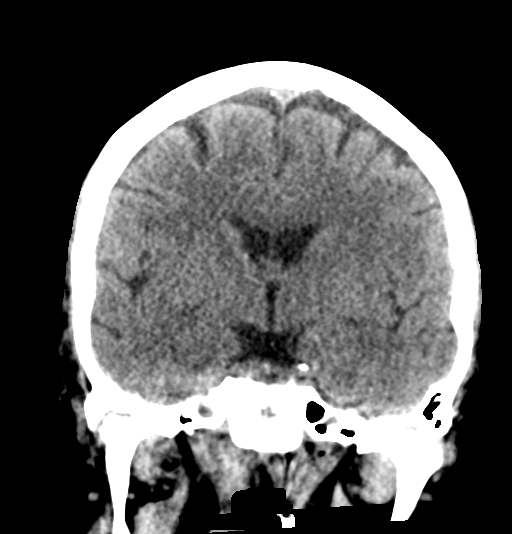

[Series 5: head without sag · sagittal · non-contrast · 0.36mm/px · 3 of 67 slices shown]
[im 23/67  brain]
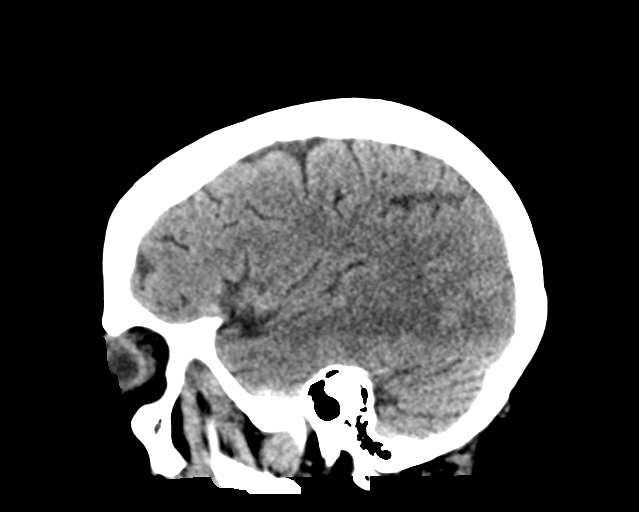
[im 34/67  brain]
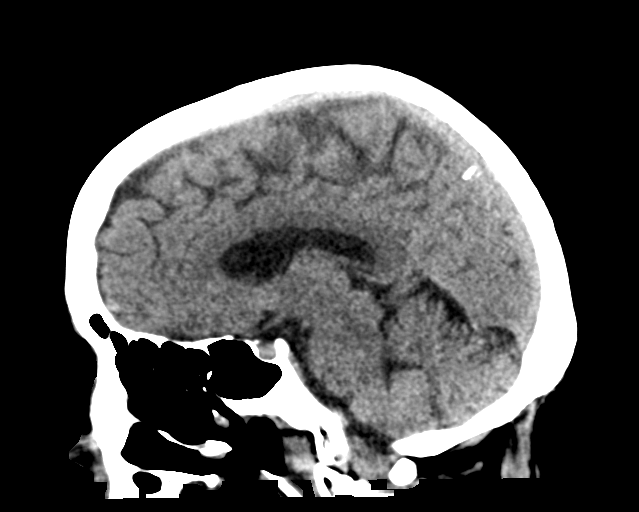
[im 45/67  brain]
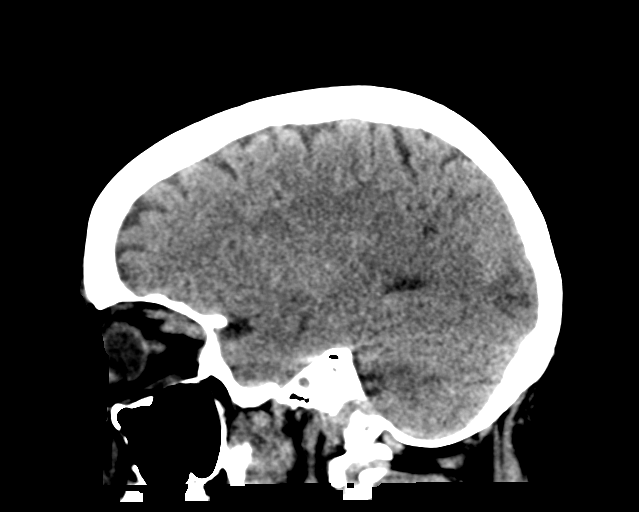

[16 of 47 positions shown; findings below may reference images not displayed]

FINDINGS: Skull and Sinuses:Negative for fracture or destructive process. The
visualized mastoids, middle ears, and imaged paranasal sinuses are
clear. Advanced right TMJ arthritis.

Visualized orbits: Negative.

Brain: There is cortical and subcortical low-density fairly
symmetrically in the occipital and parietal regions without
superimposed hemorrhage. No high-density vessel sign. Elsewhere
normal appearance of the brain. No hydrocephalus.
IMPRESSION: Symmetric parietal-occipital cortically based edema or infarct with
pattern suggesting posterior reversible encephalopathy syndrome. No
superimposed hemorrhage.

## 2017-07-31 IMAGING — CT CT ABD-PELV W/ CM
2 of 5 series · 15 of 46 positions shown, 17 images · IV contrast (Omni 300)
Comparison: None.

CLINICAL DATA: Evaluate the source of blood from NG tube, intubated

EXAM:
CT ABDOMEN AND PELVIS WITH CONTRAST
TECHNIQUE: Multidetector CT imaging of the abdomen and pelvis was performed
using the standard protocol following bolus administration of
intravenous contrast.
CONTRAST:  100mL ARPK6D-N11 IOPAMIDOL (ARPK6D-N11) INJECTION 61%

[Series 2: a/p w/ 5mm · axial · 0.92mm/px · z∈[-469,-24]mm · 12 of 101 slices shown, 14 images]
[im 6/101  soft-tissue]
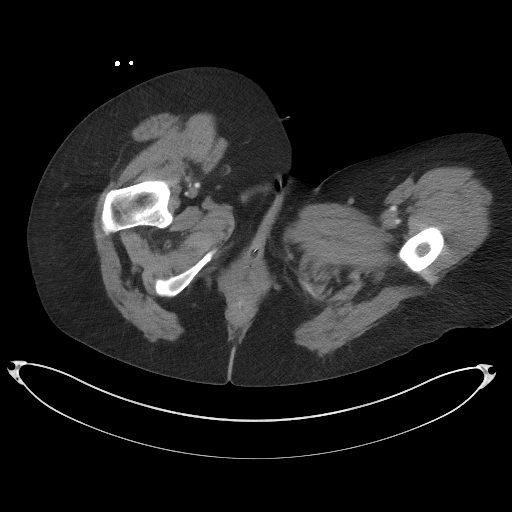
[im 6/101  bone]
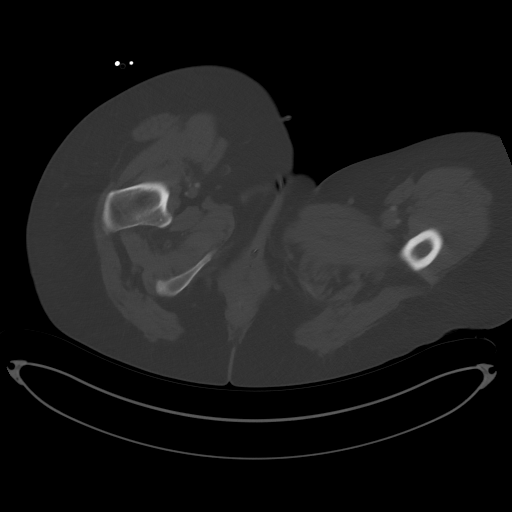
[im 17/101  soft-tissue]
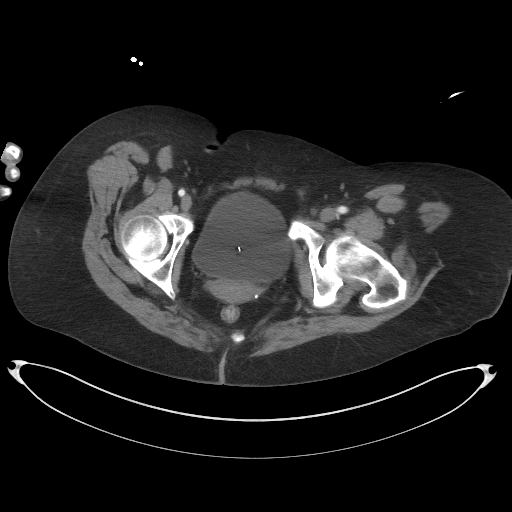
[im 23/101  soft-tissue]
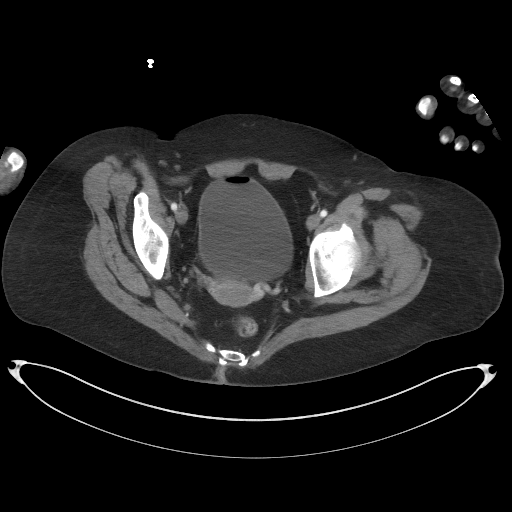
[im 28/101  soft-tissue]
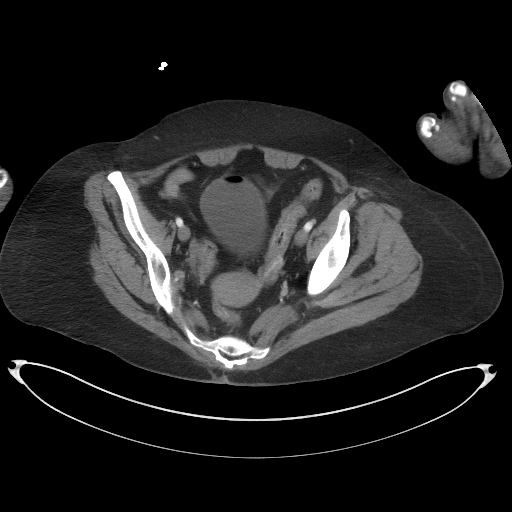
[im 39/101  soft-tissue]
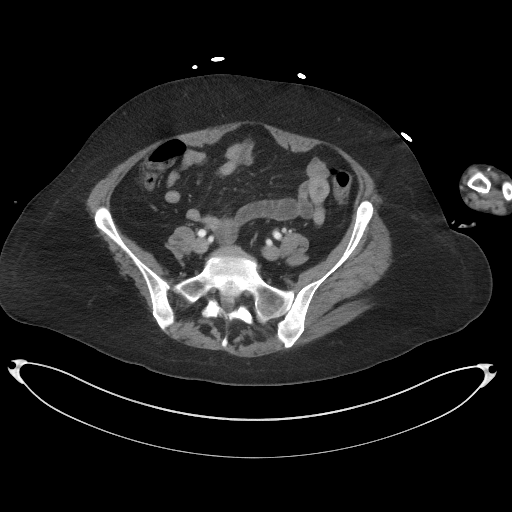
[im 45/101  soft-tissue]
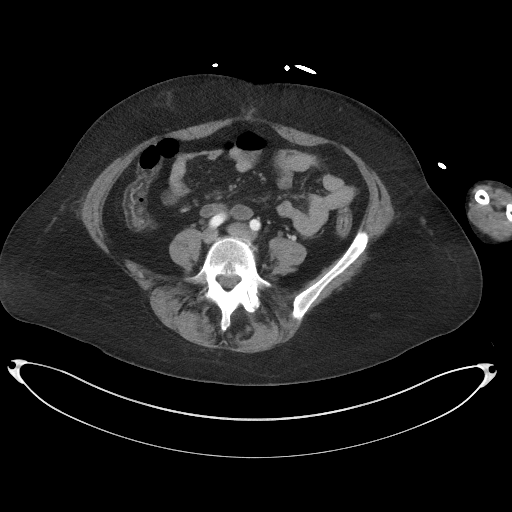
[im 56/101  soft-tissue]
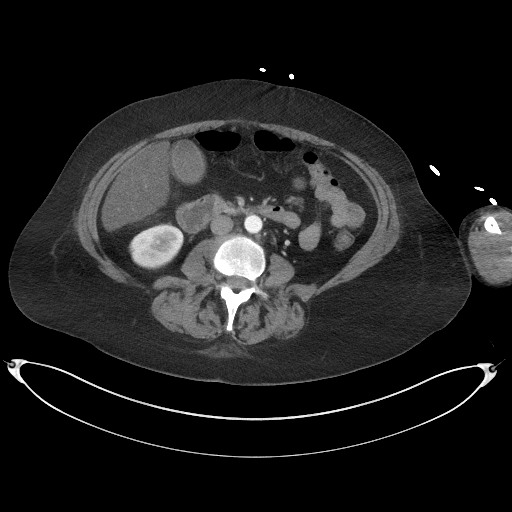
[im 62/101  soft-tissue]
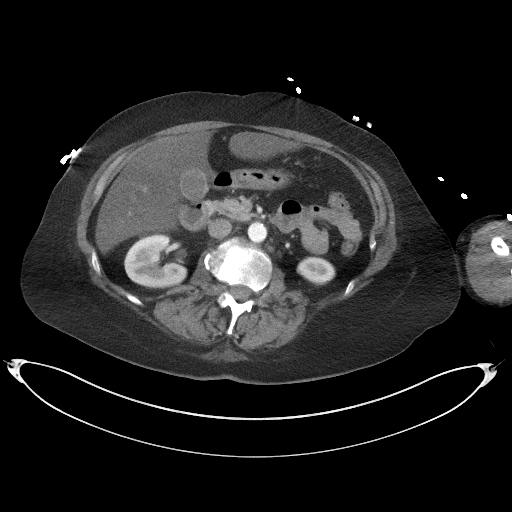
[im 73/101  soft-tissue]
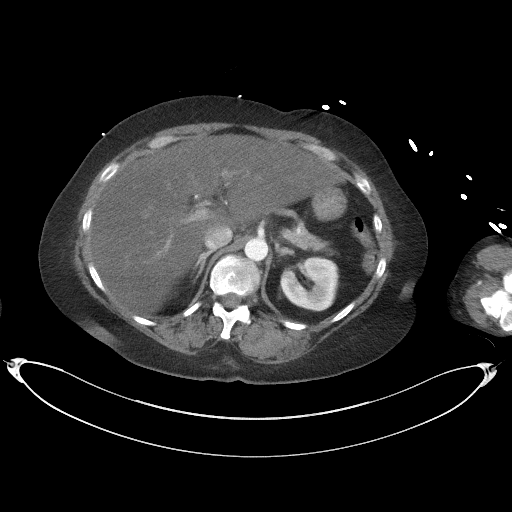
[im 73/101  bone]
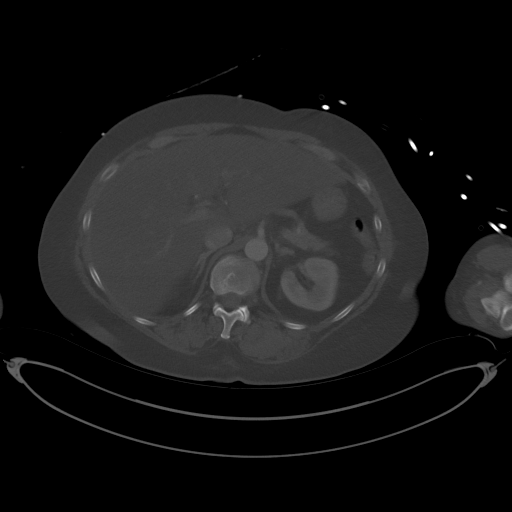
[im 78/101  soft-tissue]
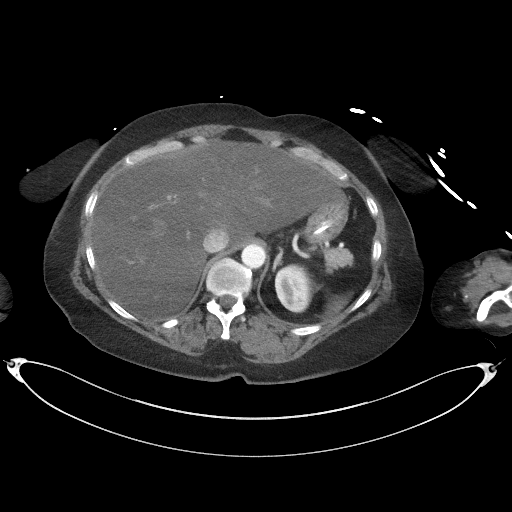
[im 84/101  soft-tissue]
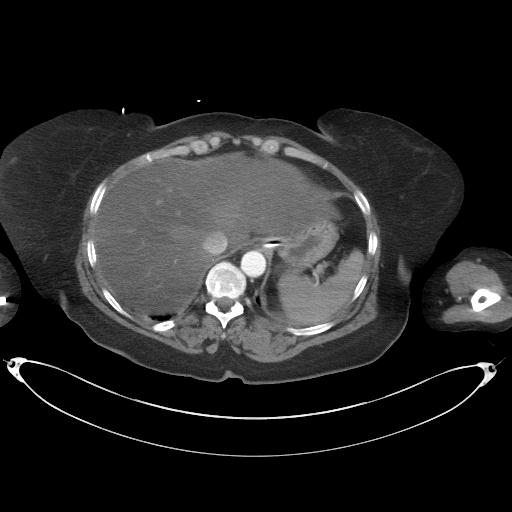
[im 95/101  soft-tissue]
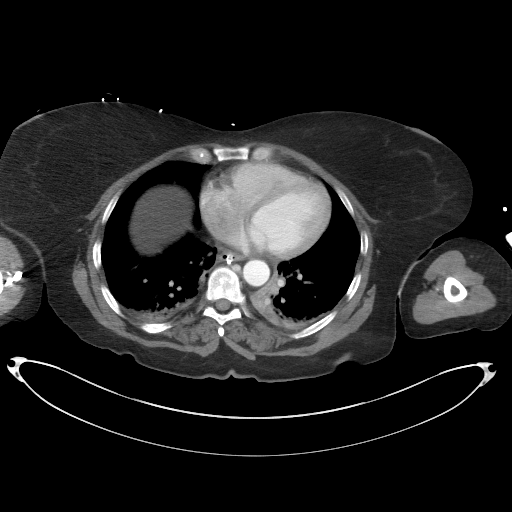

[Series 5: a/p w/ cor · coronal · 0.97mm/px · 3 of 151 slices shown]
[im 51/151  soft-tissue]
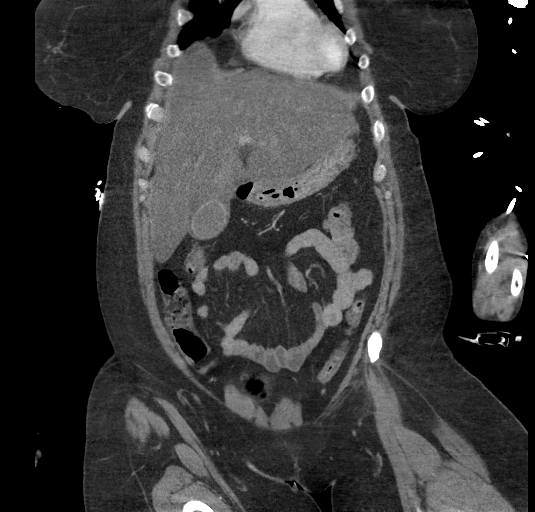
[im 67/151  soft-tissue]
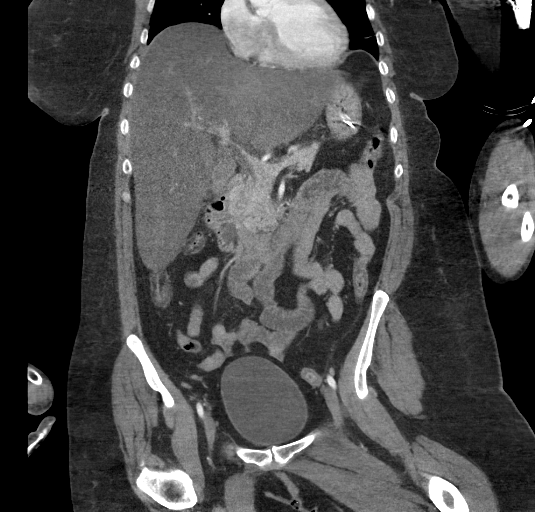
[im 84/151  soft-tissue]
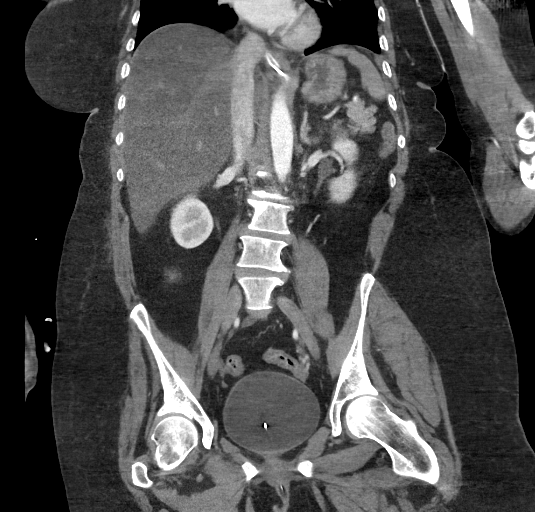

[15 of 46 positions shown; findings below may reference images not displayed]

FINDINGS: Lower chest: Bilateral tiny pleural effusion. There is bilateral
lower lobe posterior small atelectasis or infiltrate.

Hepatobiliary: Significant fatty infiltration of the liver. Mild
hepatomegaly. There is thickening of gallbladder wall up to 6.5 mm.
Probable gallbladder sludge. There is a calcified gallstone in
gallbladder neck region measures 9.5 mm. No intrahepatic biliary
ductal dilatation. No CBD dilatation.

Pancreas: Enhanced pancreas is unremarkable.

Spleen: Enhanced spleen is unremarkable.

Adrenals/Urinary Tract: No adrenal gland mass. Enhanced kidneys are
symmetrical in size. No hydronephrosis or hydroureter. Delayed renal
images shows bilateral renal symmetrical excretion. Bilateral
visualized proximal ureter is unremarkable. There is mild distended
urinary bladder. A Foley catheter is noted within urinary bladder.
Small amount of air within urinary bladder anteriorly is probable
post instrumentation.

Stomach/Bowel: There is no gastric outlet obstruction. There is a NG
tube in place with tip in proximal stomach against the lateral
gastric wall. There is no evidence of gastric wall hematoma adjacent
to tip of the NG tube at. No evidence of intraluminal hematoma.

No pericecal inflammation. Terminal ileum is unremarkable. Normal
appendix partially visualized in axial image 72.

There is no distal colonic obstruction. The right colon descending
colon and sigmoid colon is empty collapsed. Some colonic gas noted
in transverse colon.

No small bowel obstruction. No thickened or dilated small bowel
loops.

Vascular/Lymphatic: There is no retroperitoneal or mesenteric
adenopathy. No aortic aneurysm.

Reproductive: The uterus and ovaries are unremarkable. The uterus is
small size. No adnexal mass.

Other: No ascites or free air. No retroperitoneal or mesenteric
hematoma. No inguinal adenopathy.

Musculoskeletal: No destructive bony lesions are noted. Degenerative
changes are noted lumbar spine. Probable Schmorl's node deformity
noted upper endplate of L4 vertebral body. Significant disc space
flattening with vacuum disc phenomenon mild anterior spurring and
endplate sclerotic changes at L1-L2 and L2-L3 level. Mild upper
lumbar levoscoliosis.
IMPRESSION: 1. There is significant fatty infiltration of the liver. Mild
hepatomegaly.
2. There is thickening of gallbladder wall up to 6.5 mm. Clinical
correlation is necessary to exclude cholecystitis. Further
correlation with gallbladder ultrasound could be performed as
clinically warranted. Probable sludge within gallbladder lumen.
There is a calcified gallstone in neck of the gallbladder measures
9.5 mm.
3. There is NG tube with tip in proximal stomach with tip against
the lateral gastric wall. There is no evidence of intraluminal or
mural hematoma.
4. Tiny bilateral pleural effusion with bilateral basilar posterior
atelectasis or infiltrate.
5. Normal appendix.  No pericecal inflammation.
6. No small bowel obstruction.
7. Mild distended urinary bladder. Small amount of air within
urinary bladder probable post instrumentation. The bladder catheter
is noted.
8. Unremarkable uterus and ovaries.
9. Degenerative changes lumbar spine.

## 2017-10-25 DIAGNOSIS — Z0271 Encounter for disability determination: Secondary | ICD-10-CM

## 2018-02-06 ENCOUNTER — Inpatient Hospital Stay
Admit: 2018-02-06 | Discharge: 2018-02-06 | Disposition: A | Discharge status: Home / Self Care | Attending: Emergency Medical Services

## 2018-02-06 DIAGNOSIS — R112 Nausea with vomiting, unspecified: Secondary | ICD-10-CM

## 2018-02-06 LAB — HEPATIC FUNCTION PANEL
ALT: 28 U/L (ref 13–69)
AST: 72 U/L — ABNORMAL HIGH (ref 15–46)
Albumin,Serum: 4.4 g/dL (ref 3.5–5.0)
Alkaline Phosphatase: 93 U/L (ref 38–126)
Bilirubin, Direct: 0 mg/dL (ref 0.0–0.3)
Total Bilirubin: 0.4 mg/dL (ref 0.2–1.3)
Total Protein: 7.3 g/dL (ref 6.3–8.2)

## 2018-02-06 LAB — CBC WITH AUTO DIFFERENTIAL
Basophils %: 0.6 % (ref 0.0–2.0)
Basophils Absolute: 0 10*3/uL (ref 0.0–0.2)
Eosinophils Absolute: 0 10*3/uL (ref 0.0–0.5)
Eosinophils: 0.1 % — ABNORMAL LOW (ref 1.0–6.0)
Granulocytes %: 85.4 % — ABNORMAL HIGH (ref 40.0–80.0)
Hematocrit: 42.2 % (ref 35.0–47.0)
Hemoglobin: 14.6 g/dL (ref 11.7–16.0)
Lymphocyte %: 11.3 % — ABNORMAL LOW (ref 20.0–40.0)
Lymphocytes Absolute: 0.8 10*3/uL — ABNORMAL LOW (ref 1.0–4.3)
MCH: 31.3 pg (ref 26.0–34.0)
MCHC: 34.5 % (ref 32.0–36.0)
MCV: 90.8 fL (ref 79.0–98.0)
MPV: 8.4 fL (ref 7.4–10.4)
Monocytes %: 2.6 % (ref 2.0–10.0)
Monocytes Absolute: 0.2 10*3/uL (ref 0.0–0.8)
Neutrophils Absolute: 6.3 10*3/uL (ref 1.8–7.0)
Platelets: 276 10*3/uL (ref 140–440)
RBC: 4.65 10*6/uL (ref 3.80–5.20)
RDW: 13.4 % (ref 11.5–14.5)
WBC: 7.3 10*3/uL (ref 3.6–10.7)

## 2018-02-06 LAB — BASIC METABOLIC PANEL
Anion Gap: 22 NA
BUN: 26 mg/dL — ABNORMAL HIGH (ref 7–20)
CO2: 17 mmol/L — ABNORMAL LOW (ref 22–30)
Calcium: 8.7 mg/dL (ref 8.4–10.4)
Chloride: 101 mmol/L (ref 98–107)
Creatinine: 0.69 mg/dL (ref 0.52–1.25)
Glucose: 123 mg/dL — ABNORMAL HIGH (ref 70–100)
Potassium: 4 mmol/L (ref 3.5–5.1)
Sodium: 140 mmol/L (ref 135–145)
eGFR AA: >60 mL/min (ref >60)
eGFR NON-AA: >60 mL/min (ref >60)

## 2018-02-06 LAB — LACTIC ACID: Lactic Acid: 2.6 mmol/L (ref 0.7–2.0)

## 2018-02-06 LAB — LIPASE: Lipase: 62 U/L (ref 23–300)

## 2018-02-06 MED ORDER — ONDANSETRON HCL 4 MG/2ML IJ SOLN
4 MG/2ML | Freq: Once | INTRAMUSCULAR | Status: AC
Start: 2018-02-06 — End: 2018-02-06
  Administered 2018-02-06: 19:00:00 4 mg via INTRAVENOUS

## 2018-02-06 MED ORDER — ONDANSETRON 4 MG PO TBDP
4 MG | ORAL_TABLET | Freq: Three times a day (TID) | ORAL | 0 refills | Status: AC | PRN
Start: 2018-02-06 — End: ?

## 2018-02-06 MED ORDER — SODIUM CHLORIDE 0.9 % IV BOLUS
0.9 % | Freq: Once | INTRAVENOUS | Status: AC
Start: 2018-02-06 — End: 2018-02-06
  Administered 2018-02-06: 19:00:00 1000 mL via INTRAVENOUS

## 2018-02-06 MED ORDER — NORMAL SALINE FLUSH 0.9 % IV SOLN
0.9 % | Freq: Three times a day (TID) | INTRAVENOUS | Status: DC
Start: 2018-02-06 — End: 2018-02-06
  Administered 2018-02-06: 19:00:00 3 mL via INTRAVENOUS

## 2018-02-06 MED ORDER — ONDANSETRON 4 MG PO TBDP
4 MG | Freq: Once | ORAL | Status: AC
Start: 2018-02-06 — End: 2018-02-06
  Administered 2018-02-06: 21:00:00 4 mg via ORAL

## 2018-02-06 MED FILL — ONDANSETRON 4 MG PO TBDP: 4 mg | ORAL | Qty: 1

## 2018-02-06 MED FILL — ONDANSETRON HCL 4 MG/2ML IJ SOLN: 4 MG/2ML | INTRAMUSCULAR | Qty: 2

## 2018-02-06 NOTE — ED Notes (Signed)
Dr.haywood notified of lactic acid=2.6     Suszanne ConnersMichael R. Burgess Estelleeebles, RN  02/06/18 [phone]

## 2018-02-06 NOTE — ED Triage Notes (Signed)
Pt c/o nausea/vomiting x weeks-pt drank ETOH today -pt has hospital ETOH hx-pt c/o increased nausea but no pain-axox4-

## 2018-02-06 NOTE — ED Provider Notes (Signed)
[facility] EMERGENCY DEPT  eMERGENCY dEPARTMENT eNCOUnter      Pt Name: Tina Tran  MRN: [id number]  Birthdate 24-Nov-1960  Date of evaluation: 02/06/2018  Provider: Jimmie Molly, PA-C     CHIEF COMPLAINT       Chief Complaint   Patient presents with   ??? Nausea     nausea vomiting x multiple days-pt drank vodka today-ETOH hx-pt drank to help calm stomach       I evaluated this patient in conjunction with Dr. Mikey Bussing the ED attending who is in agreement with the assessment and plan.     HISTORY OF PRESENT ILLNESS   (Location/Symptom, Timing/Onset,Context/Setting, Quality, Duration, Modifying Factors, Severity) Note limiting factors.   HPI    Tina Tran is a 57 y.o. female who presents to the emergency department with nausea and vomiting times a few days.  Patient has been drinking alcohol over the past few days and has had cyclic and recurrent vomiting.  Denies any hematemesis.  States that she was drinking too relieve her symptoms.  She did have a fall and hit her right eyebrow, causing some ecchymosis.  She is on daily aspirin.  No other anticoagulation.  Denies any abdominal pain.  Denies any fevers, chills, diaphoresis, chest pain, shortness of breath.     Nursing Notes were reviewed.    REVIEW OF SYSTEMS    (2+ forlevel 4; 10+ for level 5)     Review of Systems at least 10 systems reviewed and otherwise acutely negative except as stated in HOPI.     PAST MEDICAL HISTORY   No past medical history on file.    SURGICALHISTORY     No past surgical history on file.    CURRENT MEDICATIONS       Previous Medications    No medications on file       ALLERGIES     Patient has no known allergies.    FAMILY HISTORY     No family history on file.       SOCIAL HISTORY       Social History     Socioeconomic History   ??? Marital status: Single     Spouse name: Not on file   ??? Number of children: Not on file   ??? Years of education: Not on file   ??? Highest education level: Not on file   Occupational History   ??? Not on file   Social  Needs   ??? Financial resource strain: Not on file   ??? Food insecurity:     Worry: Not on file     Inability: Not on file   ??? Transportation needs:     Medical: Not on file     Non-medical: Not on file   Tobacco Use   ??? Smoking status: Not on file   Substance and Sexual Activity   ??? Alcohol use: Not on file   ??? Drug use: Not on file   ??? Sexual activity: Not on file   Lifestyle   ??? Physical activity:     Days per week: Not on file     Minutes per session: Not on file   ??? Stress: Not on file   Relationships   ??? Social connections:     Talks on phone: Not on file     Gets together: Not on file     Attends religious service: Not on file     Active member of club or organization: Not on file  Attends meetings of clubs or organizations: Not on file     Relationship status: Not on file   ??? Intimate partner violence:     Fear of current or ex partner: Not on file     Emotionally abused: Not on file     Physically abused: Not on file     Forced sexual activity: Not on file   Other Topics Concern   ??? Not on file   Social History Narrative   ??? Not on file       SCREENINGS           PHYSICAL EXAM    (5+ for level 4, 8+ for level 5)     ED Triage Vitals [02/06/18 1433]   BP Temp Temp Source Pulse Resp SpO2 Height Weight   112/69 98.6 ??F (37 ??C) Oral 79 18 97 % 5\' 5"  (1.651 m) 138 lb (62.6 kg)       Physical Exam  GENERAL:  The patient is sitting upright in no apparent distress, appears nourished and normally developed. Vital signs as documented.     EYES:   Head exam is unremarkable. No scleral icterus or orbital trauma noted.     HEENT: Range of motion of neck with no limitations.  Neck is supple.  Mucous membranes moist. Nares patent without copious rhinorrhea.  No enlarged lymphadenopathy.      LUNGS:  Lungs are clear to auscultation, without any respiratory distress.  No wheezing, rales or rhonchi.  Nonlabored breathing.    CARDIAC:   Rhythm is regular, rate 73. No dysrythmias or murmurs.     ABDOMEN:  Bowel sounds present,  nondistended, Nontender with no obvious masses, and no peritoneal signs.  Negative Murphy sign, McBurney's point tenderness or Rovsing sign.  No CVA tenderness bilaterally.  No suprapubic tenderness.    EXTREMITIES:   Non edematous, with no obvious deformities.    SKIN:   Good color, with no significant rashes.  No pallor.    NEURO: Alert and oriented ??3.  No obvious neurological deficits, normal sensation and strength bilaterally.  patient able to ambulate.    DIAGNOSTIC RESULTS     EKG (Per Emergency Physician)       RADIOLOGY (Per Emergency Physician):       Interpretation per the Radiologist below, if available at the time of this note:  Ct Head Wo Contrast    Result Date: 02/06/2018  Patient Name:  Tina Tran, Tina Tran MRN:  [id number] FIN:  [id number] ---CT--- Exam Date/Time        02/06/2018 15:49:34 EDT                              Exam                  CT Head or Brain w/o Contrast                        Ordering Physician    Larence Penning, [facility]                            Accession Number      [id number]                                        CPT4  Codes [address] () Reason For Exam fall, head injury, ETOH and ASA Report CLINICAL INFORMATION:  Fall with head injury. Unenhanced CT images of the head from skull base to vertex were obtained. Coronal and sagittal reconstructed CT images of the head were also obtained. Postprocessing 3-D and reconstructed CT images of the head were performed and simultaneously reviewed by me on a separate workstation to better evaluate for fracture.  There were no old examinations available for comparison at the time of dictation. The ventricles and sulci are within normal limits for the patient's age.  Mild low attenuation is identified in the periventricular and subcortical white matter. This is probably related to chronic small vessel ischemic change. There is no evidence of hemorrhage, mass or large acute infarct.  There is no mass effect or significant shift of the midline  structures.  There are no extraaxial or posterior fossa masses or fluid collections.  No significant abnormalities are noted in the region of the sella turcica.   No significant abnormalities of the visualized portions of the paranasal sinuses or orbits are noted. The mastoid air cells are clear. No acute displaced skull fracture is visualized. IMPRESSION: 1. No definite evidence of an acute intracranial process. Probable mild chronic small vessel ischemic change. Report Dictated on Workstation: [id number] ---** Final ---** Dictated: 02/06/2018 4:02 pm Dictating Physician: Stann Mainland Signed Date and Time: 02/06/2018 4:17 pm Signed by: Stann Mainland Transcribed Date and Time: 02/06/2018 4:02      LABS:  Labs Reviewed   HEPATIC FUNCTION PANEL - Abnormal; Notable for the following components:       Result Value    AST 72 (*)     All other components within normal limits    Narrative:     Test Performed by [facility], [address], [place], [place]  [address]   CBC WITH AUTO DIFFERENTIAL - Abnormal; Notable for the following components:    Granulocytes % 85.4 (*)     Lymphocyte % 11.3 (*)     Eosinophils 0.1 (*)     Absolute Lymph # 0.8 (*)     All other components within normal limits    Narrative:     Test Performed by [facility], [address], [place], [place]  [address]   LACTIC ACID, PLASMA - Abnormal; Notable for the following components:    Lactic Acid 2.6 (*)     All other components within normal limits    Narrative:     Test Performed by [facility], [address] [place], [place]  [address]   BASIC METABOLIC PANEL - Abnormal; Notable for the following components:    CO2 17 (*)     Glucose 123 (*)     BUN 26 (*)     All other components within normal limits    Narrative:     Test Performed by [facility], [address], [place], [place]  [address]   LIPASE    Narrative:     Test Performed by [facility], [address] [place], [place]  [address]       All other labs were within  normal range or not returned as of this dictation.    EMERGENCY DEPARTMENT COURSE and DIFFERENTIAL DIAGNOSIS/MDM:   Vitals:    Vitals:    02/06/18 1433 02/06/18 1610   BP: 112/69 123/70   Pulse: 79 73   Resp: 18 18   Temp: 98.6 ??F (37 ??C)  TempSrc: Oral    SpO2: 97% 96%   Weight: 62.6 kg (138 lb)    Height: 5\' 5"  (1.651 m)        Medications   sodium chloride flush 0.9 % injection 3 mL (3 mLs Intravenous Given 02/06/18 1520)   0.9 % sodium chloride bolus (0 mLs Intravenous Stopped 02/06/18 1717)   ondansetron (ZOFRAN) injection 4 mg (4 mg Intravenous Given 02/06/18 1518)   ondansetron (ZOFRAN-ODT) disintegrating tablet 4 mg (4 mg Oral Given 02/06/18 1717)       MDM.  Patient is not septic or toxic appearing.  Vital stable.  She is afebrile.  Upon initial evaluation patient has a Slight no abdominal tenderness on exam.  Workup shows a slight lactic acidosis of 2.6.  She has been treated with IV fluids.  She has an aspartate aminotransferase of 72 and an ALT of 28.  I had a long discussion with the patient about alcohol cessation.  Patient states that she knows that she needs cut on her drinking.  She has not had any further nausea or vomiting.  She denies any suicidal ideation, homicidal ideation, delusions or hallucinations.  She has been clinically sober since she has been in the emergency department.  She is speaking in full, clear sentences with a steady gait.  She is to stay very hydrated at home.  She is to take Zofran as needed. Her head CT is unremarkable for acute intracranial hemorrhaging or etc. she has not had any headaches, visual changes, neck pain, neck stiffness, paresthesias, etc. while here in the Emergency Department.  She is to follow-up for repeat liver testing.  Patient is to return if any signs or symptoms worsen.  She understands and agrees with the discharge decision.  Patient is discharged home in stable condition.    CONSULTS:  None    PROCEDURES:  Unless otherwise noted below, none      Procedures    FINAL IMPRESSION      1. Non-intractable vomiting with nausea, unspecified vomiting type    2. Hematoma          DISPOSITION/PLAN   DISPOSITION Decision To Discharge 02/06/2018 05:13:28 PM      PATIENT REFERRED TO:  [facility]  [address]  [address]  [place] [id number]  [phone]          DISCHARGE MEDICATIONS:  New Prescriptions    ONDANSETRON (ZOFRAN-ODT) 4 MG DISINTEGRATING TABLET    Take 1 tablet by mouth 3 times daily as needed for Nausea or Vomiting          (Pleasenote:  Portions of this note were completed with a voice recognition program.  Efforts were made toedit the dictations but occasionally words and phrases are mis-transcribed.)    Form v2016.J.5-cn    Jimmie MollyMadalina Fong Mccarry, PA-C (electronically signed)  Emergency Medicine Provider             Jimmie MollyMadalina Sammi Stolarz, PA-C  02/06/18 1737

## 2018-02-06 NOTE — ED Notes (Signed)
Pt off unit at CT.      Clide Dalesanielle M. Hinton DyerClifford, RN  02/06/18 [phone]

## 2018-02-06 NOTE — ED Provider Notes (Signed)
Emergency Department Encounter  [facility]    Patient: Tina Tran  MRN: [id number]  DOB: 1960/08/30  Date of Evaluation: 02/06/2018  ED Supervising Physician: Dolly RiasSteven T Supreme Rybarczyk, MD    I independently examined and evaluated Tina Tran.    In brief HPI , Focused exam: Tina Tran is a 57 y.o. female that presents to the emergency department nausea and vomiting after alcohol use.    My evaluation rates normal and patient distress.    Brief ED course/MDM: nausea vomiting.  Tolerating p.o.  Reassuring workup.  Discharged    Re-Assessments:    ED Course as of Feb 13 928   Tue Feb 06, 2018   1924 Normal sinus rhythm with rate 64.  Normal axis.  ST segments within normal limits.  Normal intervals    [SH]      ED Course User Index  [SH] Dolly RiasSteven T Jackqulyn Mendel, MD       CRITICAL CARE TIME   Total Critical Care time was 0 minutes, excluding separately reportable procedures.  There was a high probability of clinically significant/life threatening deterioration in the patient's condition which required my urgent intervention.       All diagnostic, treatment, and disposition decisions were made by myself in conjunction with the APP/Resident. For all further details of the patient's emergency department visit, please see their documentation.    (Please note that portions of this note may have been completed with a voice recognition program. Efforts were made to edit the dictations but occasionally words are mis-transcribed.)    Dolly RiasSteven T Kip Kautzman, MD  [employer]        Dolly RiasSteven T Fleming Prill, MD  02/12/18 0930

## 2018-02-06 NOTE — ED Notes (Signed)
Bed: 17  Expected date:   Expected time:   Means of arrival:   Comments:  EMS [address]     Dolores Pattyiffany Shaleka Brines, RN  02/06/18 1421

## 2018-02-06 NOTE — Other (Unsigned)
Patient Acct Nbr: [id number]   Primary AUTH/CERT:   Primary Insurance Company Name: [employer]  Primary Insurance Plan name: Self Pay  Primary Insurance Group Number:   Primary Insurance Plan Type: Health  Primary Insurance Policy Number: [id number]

## 2018-02-06 NOTE — Discharge Instructions (Addendum)
Use Zofran as needed for nausea. Stop drinking alcohol. Follow up with your PCP.

## 2018-02-06 NOTE — Progress Notes (Signed)
Pt iv dc'd-pt given dc and follow up instruct and verbalized understanding-pt denies c/o-pt ate crackers/and drank diet coke-pt feeling better-tol well-
